# Patient Record
Sex: Male | Born: 2000 | Race: White | Hispanic: No | Smoking: Never smoker
Health system: Southern US, Community
[De-identification: ages and names within clinical notes are randomized; demographics above are authoritative.]

## PROBLEM LIST (undated history)

## (undated) DIAGNOSIS — F419 Anxiety disorder, unspecified: Secondary | ICD-10-CM

## (undated) DIAGNOSIS — F32A Depression, unspecified: Secondary | ICD-10-CM

## (undated) HISTORY — PX: INNER EAR SURGERY: SHX679

## (undated) HISTORY — DX: Anxiety disorder, unspecified: F41.9

## (undated) HISTORY — DX: Depression, unspecified: F32.A

---
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
## 2021-01-01 DIAGNOSIS — S060X0A Concussion without loss of consciousness, initial encounter: Secondary | ICD-10-CM | POA: Diagnosis not present

## 2021-01-01 DIAGNOSIS — Z20828 Contact with and (suspected) exposure to other viral communicable diseases: Secondary | ICD-10-CM | POA: Diagnosis not present

## 2021-01-22 DIAGNOSIS — J069 Acute upper respiratory infection, unspecified: Secondary | ICD-10-CM | POA: Diagnosis not present

## 2021-01-31 DIAGNOSIS — J0100 Acute maxillary sinusitis, unspecified: Secondary | ICD-10-CM | POA: Diagnosis not present

## 2021-01-31 DIAGNOSIS — Z20828 Contact with and (suspected) exposure to other viral communicable diseases: Secondary | ICD-10-CM | POA: Diagnosis not present

## 2021-02-28 DIAGNOSIS — Z20828 Contact with and (suspected) exposure to other viral communicable diseases: Secondary | ICD-10-CM | POA: Diagnosis not present

## 2021-03-05 DIAGNOSIS — H6991 Unspecified Eustachian tube disorder, right ear: Secondary | ICD-10-CM | POA: Diagnosis not present

## 2021-03-05 DIAGNOSIS — J309 Allergic rhinitis, unspecified: Secondary | ICD-10-CM | POA: Diagnosis not present

## 2021-05-23 ENCOUNTER — Other Ambulatory Visit: Payer: Self-pay

## 2021-05-23 DIAGNOSIS — F331 Major depressive disorder, recurrent, moderate: Secondary | ICD-10-CM

## 2021-05-23 DIAGNOSIS — Z0000 Encounter for general adult medical examination without abnormal findings: Secondary | ICD-10-CM

## 2021-05-23 DIAGNOSIS — F411 Generalized anxiety disorder: Secondary | ICD-10-CM

## 2021-05-24 ENCOUNTER — Other Ambulatory Visit: Payer: BC Managed Care – PPO

## 2021-05-24 ENCOUNTER — Other Ambulatory Visit: Payer: Self-pay

## 2021-05-24 DIAGNOSIS — Z0000 Encounter for general adult medical examination without abnormal findings: Secondary | ICD-10-CM | POA: Diagnosis not present

## 2021-05-24 DIAGNOSIS — E786 Lipoprotein deficiency: Secondary | ICD-10-CM | POA: Diagnosis not present

## 2021-05-24 DIAGNOSIS — F331 Major depressive disorder, recurrent, moderate: Secondary | ICD-10-CM | POA: Diagnosis not present

## 2021-05-24 DIAGNOSIS — F411 Generalized anxiety disorder: Secondary | ICD-10-CM | POA: Diagnosis not present

## 2021-05-25 LAB — COMPLETE METABOLIC PANEL WITH GFR
AG Ratio: 2 (calc) (ref 1.0–2.5)
ALT: 11 U/L (ref 8–46)
AST: 16 U/L (ref 12–32)
Albumin: 4.5 g/dL (ref 3.6–5.1)
Alkaline phosphatase (APISO): 48 U/L (ref 46–169)
BUN/Creatinine Ratio: 28 (calc) — ABNORMAL HIGH (ref 6–22)
BUN: 23 mg/dL — ABNORMAL HIGH (ref 7–20)
CO2: 30 mmol/L (ref 20–32)
Calcium: 9.4 mg/dL (ref 8.9–10.4)
Chloride: 104 mmol/L (ref 98–110)
Creat: 0.82 mg/dL (ref 0.60–1.26)
GFR, Est African American: 149 mL/min/{1.73_m2} (ref >=60)
GFR, Est Non African American: 128 mL/min/{1.73_m2} (ref >=60)
Globulin: 2.3 g/dL (calc) (ref 2.1–3.5)
Glucose, Bld: 76 mg/dL (ref 65–99)
Potassium: 4.4 mmol/L (ref 3.8–5.1)
Sodium: 141 mmol/L (ref 135–146)
Total Bilirubin: 0.5 mg/dL (ref 0.2–1.1)
Total Protein: 6.8 g/dL (ref 6.3–8.2)

## 2021-05-25 LAB — CBC WITH DIFFERENTIAL/PLATELET
Absolute Monocytes: 528 cells/uL (ref 200–950)
Basophils Absolute: 72 cells/uL (ref 0–200)
Basophils Relative: 1.2 %
Eosinophils Absolute: 210 cells/uL (ref 15–500)
Eosinophils Relative: 3.5 %
HCT: 44.7 % (ref 38.5–50.0)
Hemoglobin: 14.5 g/dL (ref 13.2–17.1)
Lymphs Abs: 2820 cells/uL (ref 850–3900)
MCH: 32 pg (ref 27.0–33.0)
MCHC: 32.4 g/dL (ref 32.0–36.0)
MCV: 98.7 fL (ref 80.0–100.0)
MPV: 10.1 fL (ref 7.5–12.5)
Monocytes Relative: 8.8 %
Neutro Abs: 2370 cells/uL (ref 1500–7800)
Neutrophils Relative %: 39.5 %
Platelets: 260 10*3/uL (ref 140–400)
RBC: 4.53 10*6/uL (ref 4.20–5.80)
RDW: 12.8 % (ref 11.0–15.0)
Total Lymphocyte: 47 %
WBC: 6 10*3/uL (ref 3.8–10.8)

## 2021-05-25 LAB — LIPID PANEL
Cholesterol: 120 mg/dL (ref <170)
HDL: 43 mg/dL — ABNORMAL LOW (ref >45)
LDL Cholesterol (Calc): 63 mg/dL (calc) (ref <110)
Non-HDL Cholesterol (Calc): 77 mg/dL (calc) (ref <120)
Total CHOL/HDL Ratio: 2.8 (calc) (ref <5.0)
Triglycerides: 55 mg/dL (ref <90)

## 2021-05-28 ENCOUNTER — Encounter: Payer: Self-pay | Admitting: Family Medicine

## 2021-05-28 ENCOUNTER — Other Ambulatory Visit: Payer: Self-pay

## 2021-05-28 ENCOUNTER — Ambulatory Visit (INDEPENDENT_AMBULATORY_CARE_PROVIDER_SITE_OTHER): Payer: BC Managed Care – PPO | Admitting: Family Medicine

## 2021-05-28 VITALS — BP 100/64 | HR 66 | Ht 71.0 in | Wt 120.4 lb

## 2021-05-28 DIAGNOSIS — F902 Attention-deficit hyperactivity disorder, combined type: Secondary | ICD-10-CM

## 2021-05-28 DIAGNOSIS — F331 Major depressive disorder, recurrent, moderate: Secondary | ICD-10-CM

## 2021-05-28 DIAGNOSIS — Z0000 Encounter for general adult medical examination without abnormal findings: Secondary | ICD-10-CM | POA: Diagnosis not present

## 2021-05-28 DIAGNOSIS — F411 Generalized anxiety disorder: Secondary | ICD-10-CM

## 2021-05-28 MED ORDER — ESCITALOPRAM OXALATE 10 MG PO TABS: 10.0000 mg | ORAL_TABLET | Freq: Every day | ORAL | 3 refills | Status: DC

## 2021-05-28 NOTE — Progress Notes (Signed)
Subjective:    Patient ID: Shane Gray, male    DOB: [birth year], 20 y.o.   MRN: [id number]  Shane Gray is a 20 y.o. male presenting on 05/28/2021 for Annual Exam and Anxiety  Accompanied by mother Selena Batten  HPI  Here for Annual Physical and Lab Review.  Generalized Anxiety Disorder - with specific social triggers Major Depression, recurrent moderate Possible ADHD   No prior testing formally for ADHD or prior diagnosis. He had inattention symptoms but has not pursued this since last visit.  Interval updates he was taking Escitalopram 20mg  for a while since December 2021, then he came off was only taking it sporadically, and he had some breakthrough panic attack then recently resumed Escitalopram 10mg  after had panic attack early June, now taking 10mg  daily escitalopram and doing better.  He will need a re order of medication.  Currently working still at now up to 40+ hours per week during summer.  He reports he has had issues for most of school carrier with difficulty focusing and challenging getting his homework done at times. He has problem with "zoning out" while listening to people talk at times, he describes a lot of creativity and thinking often can it can distract him from what is being said. 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   History of chronic anxiety and depression, recurrent problem. Previously diagnosed and treated.   He describes anxiety primary mental health issue, usually triggers in social situations and other scenarios that provoke it, he does not always have constant anxiety that is unprovoked. Mood is worse when anxious.  Right Swollen Tonsil Reported to have larger tonsil on R side, occasional sinus and throat infection but rarely usually allergies, and has history of strep in past. No recent problem not impacting his function swallowing speaking breathing.     Health Maintenance: Due  for HPV Vaccine, counseling provided he will reconsider this option.   UTD COVID19 x 2 doses back in August 2021, will request copy of card.  Depression screen [facility] 2/9 05/28/2021 11/23/2020  Decreased Interest 1 1  Down, Depressed, Hopeless 1 1  PHQ - 2 Score 2 2  Altered sleeping 3 3  Tired, decreased energy 1 2  Change in appetite 1 2  Feeling bad or failure about yourself  1 3  Trouble concentrating 3 0  Moving slowly or fidgety/restless 2 3  Suicidal thoughts 0 0  PHQ-9 Score 13 15  Difficult doing work/chores Somewhat difficult Somewhat difficult   GAD 7 : Generalized Anxiety Score 05/28/2021 11/23/2020  Nervous, Anxious, on Edge 1 1  Control/stop worrying 1 1  Worry too much - different things 1 2  Trouble relaxing 1 2  Restless 1 2  Easily annoyed or irritable 1 2  Afraid - awful might happen 1 1  Total GAD 7 Score 7 11  Anxiety Difficulty Somewhat difficult Somewhat difficult      Past Medical History:  Diagnosis Date   Anxiety    Depression    Past Surgical History:  Procedure Laterality Date   INNER EAR SURGERY     Social History   Socioeconomic History   Marital status: Single    Spouse name: Not on file   Number of children: Not on file   Years of education: Not on file   Highest education level: Not on file  Occupational History   Not on file  Tobacco Use  Smoking status: Never   Smokeless tobacco: Never  Substance and Sexual Activity   Alcohol use: Yes    Alcohol/week: 1.0 standard drink    Types: 1 Standard drinks or equivalent per week   Drug use: Yes    Comment: delta 8   Sexual activity: Not on file  Other Topics Concern   Not on file  Social History Narrative   Not on file   Social Determinants of Health   Financial Resource Strain: Not on file  Food Insecurity: Not on file  Transportation Needs: Not on file  Physical Activity: Not on file  Stress: Not on file  Social Connections: Not on file  Intimate Partner Violence: Not  on file   Family History  Problem Relation Age of Onset   Thyroid disease Mother    Anxiety disorder Mother    Depression Mother    Kidney Stones Mother    Kidney Stones Father    No current outpatient medications on file prior to visit.   No current facility-administered medications on file prior to visit.    Review of Systems  Constitutional:  Negative for activity change, appetite change, chills, diaphoresis, fatigue and fever.  HENT:  Negative for congestion and hearing loss.   Eyes:  Negative for visual disturbance.  Respiratory:  Negative for cough, chest tightness, shortness of breath and wheezing.   Cardiovascular:  Negative for chest pain, palpitations and leg swelling.  Gastrointestinal:  Negative for abdominal pain, constipation, diarrhea, nausea and vomiting.  Genitourinary:  Negative for dysuria, frequency and hematuria.  Musculoskeletal:  Negative for arthralgias and neck pain.  Skin:  Negative for rash.  Allergic/Immunologic: Positive for environmental allergies.  Neurological:  Negative for dizziness, weakness, light-headedness, numbness and headaches.  Hematological:  Negative for adenopathy.  Psychiatric/Behavioral:  Negative for behavioral problems, dysphoric mood and sleep disturbance.   Per HPI unless specifically indicated above     Objective:    BP 100/64   Pulse 66   Ht 5\' 11"  (1.803 m)   Wt 120 lb 6.4 oz (54.6 kg)   SpO2 100%   BMI 16.79 kg/m   Wt Readings from Last 3 Encounters:  05/28/21 120 lb 6.4 oz (54.6 kg) (5 %, Z= -1.67)*  11/23/20 125 lb (56.7 kg) (10 %, Z= -1.28)*   * Growth percentiles are based on CDC (Boys, 2-20 Years) data.    Physical Exam Vitals and nursing note reviewed.  Constitutional:      General: He is not in acute distress.    Appearance: He is well-developed. He is not diaphoretic.     Comments: Well-appearing, comfortable, cooperative  HENT:     Head: Normocephalic and atraumatic.     Right Ear: Tympanic  membrane, ear canal and external ear normal. There is no impacted cerumen.     Left Ear: Tympanic membrane, ear canal and external ear normal. There is no impacted cerumen.     Mouth/Throat:     Mouth: Mucous membranes are moist.     Pharynx: Oropharynx is clear.     Comments: Right tonsil appears slightly larger than average without complication. No erythema or swelling or stones or exudate. Left tonsil is smaller than average by comparison. Eyes:     General:        Right eye: No discharge.        Left eye: No discharge.     Conjunctiva/sclera: Conjunctivae normal.     Pupils: Pupils are equal, round, and reactive to light.  Neck:     Thyroid: No thyromegaly.     Vascular: No carotid bruit.  Cardiovascular:     Rate and Rhythm: Normal rate and regular rhythm.     Pulses: Normal pulses.     Heart sounds: Normal heart sounds. No murmur heard. Pulmonary:     Effort: Pulmonary effort is normal. No respiratory distress.     Breath sounds: Normal breath sounds. No wheezing or rales.  Abdominal:     General: Bowel sounds are normal. There is no distension.     Palpations: Abdomen is soft. There is no mass.     Tenderness: There is no abdominal tenderness.  Musculoskeletal:        General: No tenderness. Normal range of motion.     Cervical back: Normal range of motion and neck supple.     Right lower leg: No edema.     Left lower leg: No edema.     Comments: Upper / Lower Extremities: - Normal muscle tone, strength bilateral upper extremities 5/5, lower extremities 5/5  Lymphadenopathy:     Cervical: No cervical adenopathy.  Skin:    General: Skin is warm and dry.     Findings: No erythema or rash.  Neurological:     Mental Status: He is alert and oriented to person, place, and time.     Comments: Distal sensation intact to light touch all extremities  Psychiatric:        Mood and Affect: Mood normal.        Behavior: Behavior normal.        Thought Content: Thought content  normal.     Comments: Well groomed, good eye contact, normal speech and thoughts    Results for orders placed or performed in visit on 05/23/21  Lipid panel  Result Value Ref Range   Cholesterol 120 <170 mg/dL   HDL 43 (L) >62 mg/dL   Triglycerides 55 <94 mg/dL   LDL Cholesterol (Calc) 63 <765 mg/dL (calc)   Total CHOL/HDL Ratio 2.8 <5.0 (calc)   Non-HDL Cholesterol (Calc) 77 <465 mg/dL (calc)  COMPLETE METABOLIC PANEL WITH GFR  Result Value Ref Range   Glucose, Bld 76 65 - 99 mg/dL   BUN 23 (H) 7 - 20 mg/dL   Creat 0.35 4.65 - 6.81 mg/dL   GFR, Est Non African American 128 > OR = 60 mL/min/1.75m2   GFR, Est African American 149 > OR = 60 mL/min/1.70m2   BUN/Creatinine Ratio 28 (H) 6 - 22 (calc)   Sodium 141 135 - 146 mmol/L   Potassium 4.4 3.8 - 5.1 mmol/L   Chloride 104 98 - 110 mmol/L   CO2 30 20 - 32 mmol/L   Calcium 9.4 8.9 - 10.4 mg/dL   Total Protein 6.8 6.3 - 8.2 g/dL   Albumin 4.5 3.6 - 5.1 g/dL   Globulin 2.3 2.1 - 3.5 g/dL (calc)   AG Ratio 2.0 1.0 - 2.5 (calc)   Total Bilirubin 0.5 0.2 - 1.1 mg/dL   Alkaline phosphatase (APISO) 48 46 - 169 U/L   AST 16 12 - 32 U/L   ALT 11 8 - 46 U/L  CBC with Differential/Platelet  Result Value Ref Range   WBC 6.0 3.8 - 10.8 Thousand/uL   RBC 4.53 4.20 - 5.80 Million/uL   Hemoglobin 14.5 13.2 - 17.1 g/dL   HCT 27.5 17.0 - 01.7 %   MCV 98.7 80.0 - 100.0 fL   MCH 32.0 27.0 - 33.0 pg   MCHC  32.4 32.0 - 36.0 g/dL   RDW 16.112.8 09.611.0 - 04.515.0 %   Platelets 260 140 - 400 Thousand/uL   MPV 10.1 7.5 - 12.5 fL   Neutro Abs 2,370 1,500 - 7,800 cells/uL   Lymphs Abs 2,820 850 - 3,900 cells/uL   Absolute Monocytes 528 200 - 950 cells/uL   Eosinophils Absolute 210 15 - 500 cells/uL   Basophils Absolute 72 0 - 200 cells/uL   Neutrophils Relative % 39.5 %   Total Lymphocyte 47.0 %   Monocytes Relative 8.8 %   Eosinophils Relative 3.5 %   Basophils Relative 1.2 %      Assessment & Plan:   Problem List Items Addressed This Visit      Major depressive disorder, recurrent episode, moderate (HCC)   Relevant Medications   escitalopram (LEXAPRO) 10 MG tablet   GAD (generalized anxiety disorder)   Relevant Medications   escitalopram (LEXAPRO) 10 MG tablet   Other Visit Diagnoses     Annual physical exam    -  Primary   Attention deficit hyperactivity disorder (ADHD), combined type          Updated Health Maintenance information Reviewed recent lab results with patient Encouraged improvement to lifestyle with diet and exercise Goal maintain healthy weight  #Mental Health GAD, anxiety - primary issue Secondary Major Depression recurrent moderate Chronic history of mental health issues, has been managed on medication for years in past Not followed by therapist currently Resume Escitalopram 10mg  daily, has med, re order, try to do 90 day otherwise need mail order  Regarding ADHD concern - high level of suspicion that this may be clinically accurate diagnosis based on his mental health history and combination of symptoms/diagnoses - Will recommend formal ADHD testing and evaluation - handout given, if need referral he will let me know and we can assist with treatment if indicated   Follow-up sooner if need referral/treatment for ADHD or adjust on medication    Meds ordered this encounter  Medications   escitalopram (LEXAPRO) 10 MG tablet    Sig: Take 1 tablet (10 mg total) by mouth daily.    Dispense:  90 tablet    Refill:  3    Patient requests 90 day supply, if cannot be filled at this location, please let them know if he needs to use a mail order or other service.     Follow up plan: Return in about 1 year (around 05/28/2022) for 1 year for Annual Physical (PHQ/GAD med review), no labs.  Saralyn PilarAlexander Kawika Bischoff, DO [facility] 05/28/2021, 8:26 AM

## 2021-05-28 NOTE — Patient Instructions (Addendum)
Thank you for coming to the office today.  Please schedule and return for a NURSE ONLY VISIT for VACCINE - Need Gardasil - HPV Vaccine - 0 month, 2 month 6 month for 3 doses   --------------------------------------   This place does testing, therapy, and medication   [facility] Address: [address] [place], [place] [address] [email or web address] Phone: [phone]     ---------------------   This location only does testing and therapy. But no medication   [facility] [address] [place], [place] [address] Darden Amber P: [phone]   --------------------------------------------------------------   If you find a different location that is in network or preferred let me know - if you need ANY referral to these places then again let me know we can order it.   If they recommend medication or other changes sooner, contact us and schedule a visit in person or VIRTUAL / VIDEO whichever is preferred and we can talk what to do next.  Please schedule a Follow-up Appointment to: Return in about 1 year (around 05/28/2022) for 1 year for Annual Physical (PHQ/GAD med review), no labs.  If you have any other questions or concerns, please feel free to call the office or send a message through MyChart. You may also schedule an earlier appointment if necessary.  Additionally, you may be receiving a survey about your experience at our office within a few days to 1 week by e-mail or mail. We value your feedback.  Saralyn Pilar, DO [facility], [place]

## 2021-08-19 DIAGNOSIS — Z20828 Contact with and (suspected) exposure to other viral communicable diseases: Secondary | ICD-10-CM | POA: Diagnosis not present

## 2021-09-12 DIAGNOSIS — Z20828 Contact with and (suspected) exposure to other viral communicable diseases: Secondary | ICD-10-CM | POA: Diagnosis not present

## 2021-11-20 ENCOUNTER — Encounter: Payer: Self-pay | Admitting: Family Medicine

## 2021-11-20 ENCOUNTER — Other Ambulatory Visit: Payer: Self-pay

## 2021-11-20 ENCOUNTER — Ambulatory Visit: Payer: BC Managed Care – PPO | Admitting: Family Medicine

## 2021-11-20 VITALS — BP 126/65 | HR 78 | Ht 71.0 in | Wt 120.8 lb

## 2021-11-20 DIAGNOSIS — M62830 Muscle spasm of back: Secondary | ICD-10-CM

## 2021-11-20 DIAGNOSIS — F331 Major depressive disorder, recurrent, moderate: Secondary | ICD-10-CM

## 2021-11-20 DIAGNOSIS — F411 Generalized anxiety disorder: Secondary | ICD-10-CM

## 2021-11-20 MED ORDER — BACLOFEN 10 MG PO TABS: 5.0000 mg | ORAL_TABLET | Freq: Three times a day (TID) | ORAL | 1 refills | Status: DC | PRN

## 2021-11-20 NOTE — Progress Notes (Signed)
Subjective:    Patient ID: Shane Gray, male    DOB: [birth year], 20 y.o.   MRN: [id number]  Shane Gray is a 20 y.o. male presenting on 11/20/2021 for Back Pain and Anxiety   HPI  Generalized Anxiety Disorder - with specific social triggers Major Depression, recurrent moderate Possible ADHD  Previously on SSRI Escitalopram 10mg  daily for a few months, phased off of it and tried without. Worsening anxiety after that time remaining off medication. He now describes worsening mood and anxiety. He had been on SSRI for period of time.  He restarted Lexapro 10mg  daily just recently. He prefers to try this dose for now. He does not have therapist or mental health provider. But he is interested.  Additionla concern.  Groin Pain / Low Back Pain Few weeks ago, he had intercourse, and after at some point accidentally hit his left testicle and said it bothered him with some pain but improved.  Now at work, changed jobs, Heavy lifting 30-60 lbs boxes, can have aching pain in pelvic region and back / lower spine. Worries himself sick from this issue. He said already had walk in clinic visit - they checked for possible hernia. And they were diagnosed with more muscle strain. Some gradual improvement.   Questioning about poor appetite. He uses marijuana and asking if this is involved.   Admits some back spasms and muscle pain in low back., bilateral groin pain. Taking PRN Tylenol 500mg  x 2 = 1000mg   No prior testing formally for ADHD or prior diagnosis. He had inattention symptoms but has not pursued this since last visit.   Right Tonsil still very large. Has apt for this already  Depression screen [facility] 2/9 11/20/2021 05/28/2021 11/23/2020  Decreased Interest 3 1 1   Down, Depressed, Hopeless 3 1 1   PHQ - 2 Score 6 2 2   Altered sleeping 2 3 3   Tired, decreased energy 2 1 2   Change in appetite 2 1 2   Feeling bad or failure about yourself  3 1 3   Trouble concentrating 2 3 0   Moving slowly or fidgety/restless 2 2 3   Suicidal thoughts 0 0 0  PHQ-9 Score 19 13 15   Difficult doing work/chores Extremely dIfficult Somewhat difficult Somewhat difficult   GAD 7 : Generalized Anxiety Score 11/20/2021 05/28/2021 11/23/2020  Nervous, Anxious, on Edge 3 1 1   Control/stop worrying 3 1 1   Worry too much - different things 3 1 2   Trouble relaxing 3 1 2   Restless 3 1 2   Easily annoyed or irritable 3 1 2   Afraid - awful might happen 3 1 1   Total GAD 7 Score 21 7 11   Anxiety Difficulty Extremely difficult Somewhat difficult Somewhat difficult     Social History   Tobacco Use   Smoking status: Never   Smokeless tobacco: Never  Substance Use Topics   Alcohol use: Yes    Alcohol/week: 1.0 standard drink    Types: 1 Standard drinks or equivalent per week   Drug use: Yes    Comment: delta 8    Review of Systems Per HPI unless specifically indicated above     Objective:    BP 126/65    Pulse 78    Ht 5\' 11"  (1.803 m)    Wt 120 lb 12.8 oz (54.8 kg)    SpO2 100%    BMI 16.85 kg/m   Wt Readings from Last 3 Encounters:  11/20/21 120 lb 12.8 oz (54.8 kg) (4 %, Z= -  1.72)*  05/28/21 120 lb 6.4 oz (54.6 kg) (5 %, Z= -1.67)*  11/23/20 125 lb (56.7 kg) (10 %, Z= -1.28)*   * Growth percentiles are based on CDC (Boys, 2-20 Years) data.    Physical Exam Vitals and nursing note reviewed.  Constitutional:      General: He is not in acute distress.    Appearance: Normal appearance. He is well-developed. He is not diaphoretic.     Comments: Well-appearing, comfortable, cooperative  HENT:     Head: Normocephalic and atraumatic.  Eyes:     General:        Right eye: No discharge.        Left eye: No discharge.     Conjunctiva/sclera: Conjunctivae normal.  Cardiovascular:     Rate and Rhythm: Normal rate.  Pulmonary:     Effort: Pulmonary effort is normal.  Genitourinary:    Comments: Male external genitalia exam today. No obvious deformity or issues, no swelling,  edema, erythema. Exam of scrotum shows no cyst or lump or bump, non tender. No swelling.  No evidence of inguinal hernia on exam, not provoked by valsalva and maneuver today Musculoskeletal:     Comments: Bilateral low back musculature with tension and spasm hypertonicity paraspinal lumbar muscles. SLR not provoking pain. No radicular symptoms.  Bilateral inner thigh groin muscles without deformity or issues, seems to be localized pain.   Skin:    General: Skin is warm and dry.     Findings: No erythema or rash.  Neurological:     Mental Status: He is alert and oriented to person, place, and time.  Psychiatric:        Mood and Affect: Mood normal.        Behavior: Behavior normal.        Thought Content: Thought content normal.     Comments: Well groomed, good eye contact, normal speech and thoughts   Results for orders placed or performed in visit on 05/23/21  Lipid panel  Result Value Ref Range   Cholesterol 120 <170 mg/dL   HDL 43 (L) >16 mg/dL   Triglycerides 55 <10 mg/dL   LDL Cholesterol (Calc) 63 <960 mg/dL (calc)   Total CHOL/HDL Ratio 2.8 <5.0 (calc)   Non-HDL Cholesterol (Calc) 77 <454 mg/dL (calc)  COMPLETE METABOLIC PANEL WITH GFR  Result Value Ref Range   Glucose, Bld 76 65 - 99 mg/dL   BUN 23 (H) 7 - 20 mg/dL   Creat 0.98 1.19 - 1.47 mg/dL   GFR, Est Non African American 128 > OR = 60 mL/min/1.75m2   GFR, Est African American 149 > OR = 60 mL/min/1.46m2   BUN/Creatinine Ratio 28 (H) 6 - 22 (calc)   Sodium 141 135 - 146 mmol/L   Potassium 4.4 3.8 - 5.1 mmol/L   Chloride 104 98 - 110 mmol/L   CO2 30 20 - 32 mmol/L   Calcium 9.4 8.9 - 10.4 mg/dL   Total Protein 6.8 6.3 - 8.2 g/dL   Albumin 4.5 3.6 - 5.1 g/dL   Globulin 2.3 2.1 - 3.5 g/dL (calc)   AG Ratio 2.0 1.0 - 2.5 (calc)   Total Bilirubin 0.5 0.2 - 1.1 mg/dL   Alkaline phosphatase (APISO) 48 46 - 169 U/L   AST 16 12 - 32 U/L   ALT 11 8 - 46 U/L  CBC with Differential/Platelet  Result Value Ref Range    WBC 6.0 3.8 - 10.8 Thousand/uL   RBC 4.53 4.20 - 5.80  Million/uL   Hemoglobin 14.5 13.2 - 17.1 g/dL   HCT 07.6 15.1 - 83.4 %   MCV 98.7 80.0 - 100.0 fL   MCH 32.0 27.0 - 33.0 pg   MCHC 32.4 32.0 - 36.0 g/dL   RDW 37.3 57.8 - 97.8 %   Platelets 260 140 - 400 Thousand/uL   MPV 10.1 7.5 - 12.5 fL   Neutro Abs 2,370 1,500 - 7,800 cells/uL   Lymphs Abs 2,820 850 - 3,900 cells/uL   Absolute Monocytes 528 200 - 950 cells/uL   Eosinophils Absolute 210 15 - 500 cells/uL   Basophils Absolute 72 0 - 200 cells/uL   Neutrophils Relative % 39.5 %   Total Lymphocyte 47.0 %   Monocytes Relative 8.8 %   Eosinophils Relative 3.5 %   Basophils Relative 1.2 %      Assessment & Plan:   Problem List Items Addressed This Visit     Major depressive disorder, recurrent episode, moderate (HCC)   GAD (generalized anxiety disorder) - Primary   Other Visit Diagnoses     Spasm of back muscles       Relevant Medications   baclofen (LIORESAL) 10 MG tablet       #Mental Health GAD, anxiety - primary issue Secondary Major Depression recurrent moderate Chronic history of mental health issues, has been managed on medication for years in past Not followed by therapist currently  Encourage him to keep on  and resume Escitalopram 10mg  daily, has med, no new rx needed yet. Advised we can adjust dose if need or change or add other therapy.  Handout given with list of Mental health providers psych / therapist - I strongly encourage setting up another apt for mental health provider now given limited success and severity of his symptoms.  He should pursue eval for ADHD as well in future if this becomes problem.  #Back Pain Mostly characteristic of muscle spasms and strain on exam, with heavy lifting repetitive task at work. Groin pain seems related or not concerning. Scrotal exam is normal. May have groin strain with back pain but no evidence of hernia on exam.  Baclofen 5-10mg  TID PRN caution  sedation Tylenol, NSAID, topical voltaren Advice per AVS   Meds ordered this encounter  Medications   baclofen (LIORESAL) 10 MG tablet    Sig: Take 0.5-1 tablets (5-10 mg total) by mouth 3 (three) times daily as needed for muscle spasms.    Dispense:  30 each    Refill:  1      Follow up plan: Return in about 3 months (around 02/18/2022) for 3-6 month follow-up mood, anxiety, back spasms.   02/20/2022, DO [facility] 11/20/2021, 11:08 AM

## 2021-11-20 NOTE — Patient Instructions (Addendum)
Thank you for coming to the office today.  Keep on the Escitalopram Lexapro 10mg  daily for now, we can adjust your dose in future if needed.  Check out these locations and I would recommend establishing with someone to help - especially therapist and once you find where you want to go, let me know and I can refer.  ------------------------------------------------------  Likely back spasms in lumbar spine  Start taking Baclofen (Lioresal) 10mg  (muscle relaxant) - start with half (cut) to one whole pill at night as needed for next 1-3 nights (may make you drowsy, caution with driving) see how it affects you, then if tolerated increase to one pill 2 to 3 times a day or (every 8 hours as needed)  START anti inflammatory topical - OTC Voltaren (generic Diclofenac) topical 2-4 times a day as needed for pain swelling of affected joint for 1-2 weeks or longer.  Ibuprofen is okay to use as needed   These offices have both PSYCHIATRY doctors and  Available) [place] [address] [place], [address] [place] Phone: [phone]  [facility] Address: [address], [place], [address] [place] [email or web address] Phone: [phone]  [facility]) Address: [address], [place], [address] [place] Hours: 8:30AM-5PM Phone: [phone]  [facility] at [facility] [address] [place], [address] [place] Phone: [phone]  [facility] (All ages) [address], [facility] [place] [place], [place] Phone: [phone] (Option 1) [email or web address]  ----------------------------------------------------------------- THERAPIST ONLY  (No Psychiatry)  [facility] [address] [place], [address] [place] P: [phone]  Darden Amber, LCSW [address] [place], [place] Main [place]: [phone]  [facility]   Address: [address] [place], [place], [place] Hours: Open today  9AM-7PM Phone: [phone]  [facility] [address] [facility]  - [facility] Address: [address], [place], [address] [place] Phone: [phone]   Please schedule a Follow-up Appointment to: Return in about 3 months (around 02/18/2022) for 3-6 month follow-up mood, anxiety, back spasms.  If you have any other questions or concerns, please feel free to call the office or send a message through MyChart. You may also schedule an earlier appointment if necessary.  Additionally, you may be receiving a survey about your experience at our office within a few days to 1 week by e-mail or mail. We value your feedback.  [phone], DO [facility], Saralyn Pilar

## 2021-11-25 ENCOUNTER — Ambulatory Visit: Payer: Self-pay | Admitting: *Deleted

## 2021-11-25 NOTE — Telephone Encounter (Signed)
Reason for Disposition  Pain radiates into groin, scrotum  Answer Assessment - Initial Assessment Questions 1. LOCATION: "Where does it hurt?" (e.g., left, right)     Pt calling in c/o back pain and testicular pain.   Right lower flank to thigh last night pain.    When I move it's worse.   No urinary symptoms.   No kidney stone history.    2. ONSET: "When did the pain start?"     A week-2 weeks ago Tylenol or ibuprofen helps a little.   Dr. Althea Charon gave me back medicine that helps for muscle spasms.   3. SEVERITY: "How bad is the pain?" (e.g., Scale 1-10; mild, moderate, or severe)   - MILD (1-3): doesn't interfere with normal activities    - MODERATE (4-7): interferes with normal activities or awakens from sleep    - SEVERE (8-10): excruciating pain and patient unable to do normal activities (stays in bed)       3 this morning.    Worst 7 last night 4. PATTERN: "Does the pain come and go, or is it constant?"      Comes and goes.   I feel it when I'm not doing anything 5. CAUSE: "What do you think is causing the pain?"     I was working and fell on my back a couple of times.   It could STD.   6. OTHER SYMPTOMS:  "Do you have any other symptoms?" (e.g., fever, abdominal pain, vomiting, leg weakness, burning with urination, blood in urine)     Pain in testicle but went away and now the pain is back in left one. 7. PREGNANCY:  "Is there any chance you are pregnant?" "When was your last menstrual period?"     N/A  Protocols used: Flank Pain-A-AH

## 2021-11-25 NOTE — Telephone Encounter (Addendum)
Spoke with the pt's mom. 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 I have made him an appt for 12/22 at 9:20.

## 2021-11-25 NOTE — Telephone Encounter (Signed)
°  Chief Complaint: left back pain into left testicle Symptoms: above Frequency: intermittent Pertinent Negatives: Patient denies urinary symptoms.   Requesting STD testing.   Has seen Dr. Althea Charon for this pain before. Disposition: [] ED /[] Urgent Care (no appt availability in office) / [x] Appointment(In office/virtual)/ []  [place] [facility]/ [] Home Care/ [] Refused Recommended Disposition  Additional Notes: I have sent a note to Medical to see if they can get him an earlier appt.   No one answered the phone when I tried to call into the practice.   Pt was agreeable to this plan.    Notes sent to [facility]

## 2021-11-28 ENCOUNTER — Other Ambulatory Visit (HOSPITAL_COMMUNITY)
Admission: RE | Admit: 2021-11-28 | Discharge: 2021-11-28 | Disposition: A | Discharge status: Home / Self Care | Payer: BC Managed Care – PPO | Source: Ambulatory Visit | Attending: Family Medicine | Admitting: Family Medicine

## 2021-11-28 ENCOUNTER — Ambulatory Visit: Payer: BC Managed Care – PPO | Admitting: Family Medicine

## 2021-11-28 ENCOUNTER — Other Ambulatory Visit: Payer: Self-pay

## 2021-11-28 ENCOUNTER — Encounter: Payer: Self-pay | Admitting: Family Medicine

## 2021-11-28 VITALS — BP 115/71 | HR 91 | Ht 71.0 in | Wt 116.4 lb

## 2021-11-28 DIAGNOSIS — Z113 Encounter for screening for infections with a predominantly sexual mode of transmission: Secondary | ICD-10-CM

## 2021-11-28 DIAGNOSIS — F411 Generalized anxiety disorder: Secondary | ICD-10-CM

## 2021-11-28 DIAGNOSIS — M62830 Muscle spasm of back: Secondary | ICD-10-CM

## 2021-11-28 DIAGNOSIS — B372 Candidiasis of skin and nail: Secondary | ICD-10-CM

## 2021-11-28 DIAGNOSIS — M545 Low back pain, unspecified: Secondary | ICD-10-CM

## 2021-11-28 DIAGNOSIS — R29898 Other symptoms and signs involving the musculoskeletal system: Secondary | ICD-10-CM

## 2021-11-28 MED ORDER — CLOTRIMAZOLE 1 % EX CREA: 1.0000 "application " | TOPICAL_CREAM | Freq: Two times a day (BID) | CUTANEOUS | 0 refills | Status: DC

## 2021-11-28 NOTE — Patient Instructions (Addendum)
Thank you for coming to the office today.  [facility] Dept  Appointments: [phone] For all STD testing for free  For Back X-ray - order is in, make sure it has been at least 4 weeks since it started, if you decide to get done. Walk in first come first serve, 8am through 4pm - Mon-Thurs, not on Friday. Not during lunch hour 12-1.  STD testing today blood and urine, checking for HIV Syphilis, Gonorrhea, Chlamydia, few days for urine results, about 24-48 hr on blood results.  For the rash, in the groin, looks like yeast or candida rash. Use topical anti fungal twice a day for 2-4 weeks. - likely from heat/moisture/sweat   Please schedule a Follow-up Appointment to: Return if symptoms worsen or fail to improve.  If you have any other questions or concerns, please feel free to call the office or send a message through MyChart. You may also schedule an earlier appointment if necessary.  Additionally, you may be receiving a survey about your experience at our office within a few days to 1 week by e-mail or mail. We value your feedback.  Saralyn Pilar, DO [facility], [place]

## 2021-11-28 NOTE — Progress Notes (Signed)
Subjective:    Patient ID: Shane Gray, male    DOB: 2001-06-12, 20 y.o.   MRN: [id number]  Shane Gray is a 20 y.o. male presenting on 11/28/2021 for Exposure to STD and Back Pain   HPI  STD Screening Rash Groin Pain / Low Back Pain Few weeks ago, he had intercourse, and after at some point accidentally hit his left testicle and said it bothered him with some pain but improved.   Work with Heavy lifting 30-60 lbs boxes, can have aching pain in pelvic region and back / lower spine. Worries himself sick from this issue. He said already had walk in clinic visit - they checked for possible hernia. And they were diagnosed with more muscle strain. Some gradual improvement.    Last visit 1 week ago approx. He has improved some, but has concerns with rash now in groin. He has redness in R groin inguinal crease. Some burning of skin at times. No raised rash or vesicles.  He also endorses some episodic weakness of arms and legs feels it can be anxiety or pinched nerve. Worse with repetitive activity at work.    Depression screen [facility] 2/9 11/20/2021 05/28/2021 11/23/2020  Decreased Interest 3 1 1   Down, Depressed, Hopeless 3 1 1   PHQ - 2 Score 6 2 2   Altered sleeping 2 3 3   Tired, decreased energy 2 1 2   Change in appetite 2 1 2   Feeling bad or failure about yourself  3 1 3   Trouble concentrating 2 3 0  Moving slowly or fidgety/restless 2 2 3   Suicidal thoughts 0 0 0  PHQ-9 Score 19 13 15   Difficult doing work/chores Extremely dIfficult Somewhat difficult Somewhat difficult    Social History   Tobacco Use   Smoking status: Never   Smokeless tobacco: Never  Substance Use Topics   Alcohol use: Yes    Alcohol/week: 1.0 standard drink    Types: 1 Standard drinks or equivalent per week   Drug use: Yes    Comment: delta 8    Review of Systems Per HPI unless specifically indicated above     Objective:    BP 115/71    Pulse 91    Ht 5\' 11"  (1.803 m)    Wt 116  lb 6.4 oz (52.8 kg)    SpO2 100%    BMI 16.23 kg/m   Wt Readings from Last 3 Encounters:  11/28/21 116 lb 6.4 oz (52.8 kg) (2 %, Z= -2.03)*  11/20/21 120 lb 12.8 oz (54.8 kg) (4 %, Z= -1.72)*  05/28/21 120 lb 6.4 oz (54.6 kg) (5 %, Z= -1.67)*   * Growth percentiles are based on CDC (Boys, 2-20 Years) data.    Physical Exam Vitals and nursing note reviewed.  Constitutional:      General: He is not in acute distress.    Appearance: Normal appearance. He is well-developed. He is not diaphoretic.     Comments: Well-appearing, comfortable, cooperative  HENT:     Head: Normocephalic and atraumatic.  Eyes:     General:        Right eye: No discharge.        Left eye: No discharge.     Conjunctiva/sclera: Conjunctivae normal.  Cardiovascular:     Rate and Rhythm: Normal rate.  Pulmonary:     Effort: Pulmonary effort is normal.  Skin:    General: Skin is warm and dry.     Findings: Rash (R inguinal skin fold  slight erythema with candidal rash) present. No erythema.  Neurological:     Mental Status: He is alert and oriented to person, place, and time.  Psychiatric:        Mood and Affect: Mood normal.        Behavior: Behavior normal.        Thought Content: Thought content normal.     Comments: Well groomed, good eye contact, normal speech and thoughts   Results for orders placed or performed in visit on 05/23/21  Lipid panel  Result Value Ref Range   Cholesterol 120 <170 mg/dL   HDL 43 (L) >61 mg/dL   Triglycerides 55 <60 mg/dL   LDL Cholesterol (Calc) 63 <737 mg/dL (calc)   Total CHOL/HDL Ratio 2.8 <5.0 (calc)   Non-HDL Cholesterol (Calc) 77 <106 mg/dL (calc)  COMPLETE METABOLIC PANEL WITH GFR  Result Value Ref Range   Glucose, Bld 76 65 - 99 mg/dL   BUN 23 (H) 7 - 20 mg/dL   Creat 2.69 4.85 - 4.62 mg/dL   GFR, Est Non African American 128 > OR = 60 mL/min/1.22m2   GFR, Est African American 149 > OR = 60 mL/min/1.31m2   BUN/Creatinine Ratio 28 (H) 6 - 22 (calc)   Sodium  141 135 - 146 mmol/L   Potassium 4.4 3.8 - 5.1 mmol/L   Chloride 104 98 - 110 mmol/L   CO2 30 20 - 32 mmol/L   Calcium 9.4 8.9 - 10.4 mg/dL   Total Protein 6.8 6.3 - 8.2 g/dL   Albumin 4.5 3.6 - 5.1 g/dL   Globulin 2.3 2.1 - 3.5 g/dL (calc)   AG Ratio 2.0 1.0 - 2.5 (calc)   Total Bilirubin 0.5 0.2 - 1.1 mg/dL   Alkaline phosphatase (APISO) 48 46 - 169 U/L   AST 16 12 - 32 U/L   ALT 11 8 - 46 U/L  CBC with Differential/Platelet  Result Value Ref Range   WBC 6.0 3.8 - 10.8 Thousand/uL   RBC 4.53 4.20 - 5.80 Million/uL   Hemoglobin 14.5 13.2 - 17.1 g/dL   HCT 70.3 50.0 - 93.8 %   MCV 98.7 80.0 - 100.0 fL   MCH 32.0 27.0 - 33.0 pg   MCHC 32.4 32.0 - 36.0 g/dL   RDW 18.2 99.3 - 71.6 %   Platelets 260 140 - 400 Thousand/uL   MPV 10.1 7.5 - 12.5 fL   Neutro Abs 2,370 1,500 - 7,800 cells/uL   Lymphs Abs 2,820 850 - 3,900 cells/uL   Absolute Monocytes 528 200 - 950 cells/uL   Eosinophils Absolute 210 15 - 500 cells/uL   Basophils Absolute 72 0 - 200 cells/uL   Neutrophils Relative % 39.5 %   Total Lymphocyte 47.0 %   Monocytes Relative 8.8 %   Eosinophils Relative 3.5 %   Basophils Relative 1.2 %      Assessment & Plan:   Problem List Items Addressed This Visit     GAD (generalized anxiety disorder)   Other Visit Diagnoses     Screening examination for STD (sexually transmitted disease)    -  Primary   Relevant Orders   GC/Chlamydia probe amp ([place])not at [facility]   HIV Antibody (routine testing w rflx)   RPR   Spasm of back muscles       Candidal intertrigo       Relevant Medications   clotrimazole (LOTRIMIN) 1 % cream   Muscular deconditioning       Acute bilateral  low back pain without sciatica       Relevant Orders   DG Lumbar Spine Complete       GAD Underlying anxiety likely component.  STD Screening No confirmed exposure Ordered GC Chlamydia urine today, HIV RPR labs F/u results  Candidal Intertrigo Mild R inguinal Rx Clotrimazole cream  Back  Pain, Spasms Likely more deconditioning No acute injury Reassuring - history of weakness is not consistent with exam and seems to be more within normal range. Ordered Lumbar X-ray for FUTURE ONLY if he has worsening symptoms or new concerns >4 weeks he can do walk in for x-ray  Orders Placed This Encounter  Procedures   DG Lumbar Spine Complete    Standing Status:   Future    Standing Expiration Date:   11/28/2022    Order Specific Question:   Reason for Exam (SYMPTOM  OR DIAGNOSIS REQUIRED)    Answer:   low back pain 4 weeks, heavy lifting job has some symptoms radiating into legs    Order Specific Question:   Preferred imaging location?    Answer:   [facility]   HIV Antibody (routine testing w rflx)   RPR     Meds ordered this encounter  Medications   clotrimazole (LOTRIMIN) 1 % cream    Sig: Apply 1 application topically 2 (two) times daily. For up to 2-4 weeks for rash    Dispense:  30 g    Refill:  0      Follow up plan: Return if symptoms worsen or fail to improve.   Saralyn Pilar, DO [facility] 11/28/2021, 9:48 AM

## 2021-11-29 ENCOUNTER — Encounter: Payer: Self-pay | Admitting: Family Medicine

## 2021-11-29 LAB — HIV ANTIBODY (ROUTINE TESTING W REFLEX): HIV 1&2 Ab, 4th Generation: NONREACTIVE

## 2021-11-29 LAB — RPR: RPR Ser Ql: NONREACTIVE

## 2021-12-03 LAB — GC/CHLAMYDIA PROBE AMP (~~LOC~~) NOT AT ARMC
Chlamydia: NEGATIVE
Comment: NEGATIVE
Comment: NORMAL
Neisseria Gonorrhea: NEGATIVE

## 2021-12-05 ENCOUNTER — Encounter: Payer: Self-pay | Admitting: Family Medicine

## 2021-12-12 ENCOUNTER — Encounter: Payer: Self-pay | Admitting: Family Medicine

## 2021-12-12 ENCOUNTER — Other Ambulatory Visit: Payer: Self-pay

## 2021-12-12 ENCOUNTER — Ambulatory Visit: Payer: Self-pay | Admitting: Family Medicine

## 2021-12-12 DIAGNOSIS — B356 Tinea cruris: Secondary | ICD-10-CM | POA: Diagnosis not present

## 2021-12-12 DIAGNOSIS — R21 Rash and other nonspecific skin eruption: Secondary | ICD-10-CM | POA: Diagnosis not present

## 2021-12-12 DIAGNOSIS — Z708 Other sex counseling: Secondary | ICD-10-CM

## 2021-12-12 NOTE — Progress Notes (Signed)
S: pt in clinic, requesting testing for HSV, pt was recently tested for other STI's at PCP on 11/28/21.  Pt denies any sexual encounters since testing.  Pt also reports some itching between thigh and testicles.    O: [facility] -  testing for RPR, HIV, GC, and CT- negative   Male genitalia exam: normal, penis: no lesions or discharge. testes: no masses or tenderness. no hernias Pt as mild erythema area in groin area.    A: .1. Encounter for sexually transmitted disease counseling  P: discussed with patient about HSV testing at [facility].  Recommended UC or PCP for testing for HSV via blood work.   Pt. Denies any ulcers, lesions or open sores.  Pt declined any other STI testing today.     Pt reports sweating and groin area moist mostly while at work.   Recommend OTC clotrimazole 1% cream.   Pt encourage to RTC as needed.    Junious Dresser, FNP

## 2021-12-25 DIAGNOSIS — J351 Hypertrophy of tonsils: Secondary | ICD-10-CM | POA: Diagnosis not present

## 2022-06-02 ENCOUNTER — Encounter: Payer: BC Managed Care – PPO | Admitting: Family Medicine

## 2022-06-30 ENCOUNTER — Other Ambulatory Visit: Payer: Self-pay | Admitting: Family Medicine

## 2022-06-30 DIAGNOSIS — F331 Major depressive disorder, recurrent, moderate: Secondary | ICD-10-CM

## 2022-06-30 DIAGNOSIS — F411 Generalized anxiety disorder: Secondary | ICD-10-CM

## 2022-07-02 NOTE — Telephone Encounter (Signed)
Requested medication (s) are due for refill today:yes  Requested medication (s) are on the active medication list yes  Last refill: 05/29/21  Future visit scheduled no  Notes to clinic:  Unable to refill per protocol, appointment needed. Called pt, no answer and unable to leave VM. Routing for approval.     Requested Prescriptions  Pending Prescriptions Disp Refills   escitalopram (LEXAPRO) 10 MG tablet [Pharmacy Med Name: ESCITALOPRAM 10MG  TABLETS] 90 tablet 3    Sig: TAKE 1 TABLET(10 MG) BY MOUTH DAILY     Psychiatry:  Antidepressants - SSRI Failed - 06/30/2022  1:11 PM      Failed - Valid encounter within last 6 months    Recent Outpatient Visits           7 months ago Screening examination for STD (sexually transmitted disease)   [facility], DO   7 months ago GAD (generalized anxiety disorder)   [facility], DO   1 year ago Annual physical exam   [facility], DO   1 year ago GAD (generalized anxiety disorder)   [facility], DO              Passed - Completed PHQ-2 or PHQ-9 in the last 360 days

## 2022-08-31 ENCOUNTER — Encounter: Payer: Self-pay | Admitting: Family Medicine

## 2022-09-03 ENCOUNTER — Encounter: Payer: Self-pay | Admitting: Family Medicine

## 2022-09-03 ENCOUNTER — Ambulatory Visit: Payer: BC Managed Care – PPO | Admitting: Family Medicine

## 2022-09-03 VITALS — BP 95/58 | HR 64 | Ht 71.0 in | Wt 114.2 lb

## 2022-09-03 DIAGNOSIS — F411 Generalized anxiety disorder: Secondary | ICD-10-CM

## 2022-09-03 DIAGNOSIS — F331 Major depressive disorder, recurrent, moderate: Secondary | ICD-10-CM

## 2022-09-03 MED ORDER — BUSPIRONE HCL 5 MG PO TABS: 5.0000 mg | ORAL_TABLET | Freq: Three times a day (TID) | ORAL | 3 refills | Status: DC | PRN
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
## 2022-09-03 NOTE — Patient Instructions (Addendum)
Thank you for coming to the office today.  Keep on Escitalopram 10mg  daily, no change today.  Add new medication for Anxiety Buspar (buspirone) 5mg  tablet, take up to max 3 times per day, approximately every 6-8 hours if needed. Can take once or twice a day every day consistently at first and use the 3rd pill as a back up plan if needed.  Future we can reconsider the Wellbutrin for mood, can also work together with these meds.  Next options also - would consider switching off Lexapro for a different version. - Zoloft (sertraline) recommended.  Muscle tension in back / shoulders feels consistent with muscle spasm from stress and can cause your symptoms.  Please schedule a Follow-up Appointment to: Return if symptoms worsen or fail to improve.  If you have any other questions or concerns, please feel free to call the office or send a message through [place]. You may also schedule an earlier appointment if necessary.  Additionally, you may be receiving a survey about your experience at our office within a few days to 1 week by e-mail or mail. We value your feedback.  Nobie Putnam, DO [place]

## 2022-10-22 ENCOUNTER — Encounter: Payer: BC Managed Care – PPO | Admitting: Family Medicine

## 2022-10-24 ENCOUNTER — Encounter: Payer: Self-pay | Admitting: Family Medicine

## 2022-10-24 ENCOUNTER — Ambulatory Visit: Payer: BC Managed Care – PPO | Admitting: Family Medicine

## 2022-10-24 VITALS — BP 105/61 | HR 78 | Ht 70.0 in | Wt 117.2 lb

## 2022-10-24 DIAGNOSIS — Z0000 Encounter for general adult medical examination without abnormal findings: Secondary | ICD-10-CM | POA: Diagnosis not present

## 2022-10-24 DIAGNOSIS — F411 Generalized anxiety disorder: Secondary | ICD-10-CM

## 2022-10-24 DIAGNOSIS — F331 Major depressive disorder, recurrent, moderate: Secondary | ICD-10-CM | POA: Diagnosis not present

## 2022-10-24 MED ORDER — ESCITALOPRAM OXALATE 10 MG PO TABS: 10.0000 mg | ORAL_TABLET | Freq: Every day | ORAL | 3 refills | Status: DC

## 2022-10-24 NOTE — Progress Notes (Signed)
Subjective:    Patient ID: Shane Gray, male    DOB: 28-Aug-2001, 21 y.o.   MRN: [id number]  Shane Gray is a 21 y.o. male presenting on 10/24/2022 for Annual Exam   HPI  Here for Annual Physical and Lab Review.   Generalized Anxiety Disorder - with specific social triggers Major Depression, recurrent moderate Possible ADHD  Interval update since last visit 08/2022 started on Buspar 5mg  THREE TIMES A DAY as needed, has done well taking it occasionally. It is helpful to reduce anxiety. - Forgetfulness minor things but overall not impacting his function.  Overall anxiety is improving overall. It is currently manageable and eating well sleeping well  Continues Escitalopram 10mg  daily    Health Maintenance: Due for HPV Vaccine, counseling provided he will reconsider this option.   UTD COVID19 x 2 doses back in August 2021, will request copy of card.      10/24/2022    9:14 AM 09/03/2022    9:40 AM 11/20/2021   11:02 AM  Depression screen PHQ 2/9  Decreased Interest 2 3 3   Down, Depressed, Hopeless 1 3 3   PHQ - 2 Score 3 6 6   Altered sleeping 3 3 2   Tired, decreased energy 3 3 2   Change in appetite 2 3 2   Feeling bad or failure about yourself  1 3 3   Trouble concentrating 2 2 2   Moving slowly or fidgety/restless 2 3 2   Suicidal thoughts 1 1 0  PHQ-9 Score 17 24 19   Difficult doing work/chores Somewhat difficult Very difficult Extremely dIfficult      10/24/2022    9:14 AM 09/03/2022    9:41 AM 11/20/2021   11:02 AM 05/28/2021    8:11 AM  GAD 7 : Generalized Anxiety Score  Nervous, Anxious, on Edge 2 3 3 1   Control/stop worrying 2 3 3 1   Worry too much - different things 2 3 3 1   Trouble relaxing 2 3 3 1   Restless 2 3 3 1   Easily annoyed or irritable 2 3 3 1   Afraid - awful might happen 2 3 3 1   Total GAD 7 Score 14 21 21 7   Anxiety Difficulty Very difficult Very difficult Extremely difficult Somewhat difficult     Past Medical History:   Diagnosis Date   Anxiety    Depression    Past Surgical History:  Procedure Laterality Date   INNER EAR SURGERY     Social History   Socioeconomic History   Marital status: Single    Spouse name: Not on file   Number of children: Not on file   Years of education: Not on file   Highest education level: Not on file  Occupational History   Not on file  Tobacco Use   Smoking status: Never   Smokeless tobacco: Never  Vaping Use   Vaping Use: Never used  Substance and Sexual Activity   Alcohol use: Yes    Alcohol/week: 2.0 standard drinks of alcohol    Types: 1 Cans of beer, 1 Standard drinks or equivalent per week   Drug use: Yes    Types: Marijuana    Comment: delta 8, last used used 11/28/21   Sexual activity: Yes    Birth control/protection: Condom  Other Topics Concern   Not on file  Social History Narrative   Not on file   Social Determinants of Health   Financial Resource Strain: Not on file  Food Insecurity: Not on file  Transportation Needs:  Not on file  Physical Activity: Not on file  Stress: Not on file  Social Connections: Not on file  Intimate Partner Violence: Not At Risk (12/12/2021)   Humiliation, Afraid, Rape, and Kick questionnaire    Fear of Current or Ex-Partner: No    Emotionally Abused: No    Physically Abused: No    Sexually Abused: No   Family History  Problem Relation Age of Onset   Thyroid disease Mother    Anxiety disorder Mother    Depression Mother    Kidney Stones Mother    Kidney Stones Father    Current Outpatient Medications on File Prior to Visit  Medication Sig   busPIRone (BUSPAR) 5 MG tablet Take 1 tablet (5 mg total) by mouth 3 (three) times daily as needed (anxiety).   No current facility-administered medications on file prior to visit.    Review of Systems  Constitutional:  Negative for activity change, appetite change, chills, diaphoresis, fatigue and fever.  HENT:  Negative for congestion and hearing loss.    Eyes:  Negative for visual disturbance.  Respiratory:  Negative for cough, chest tightness, shortness of breath and wheezing.   Cardiovascular:  Negative for chest pain, palpitations and leg swelling.  Gastrointestinal:  Negative for abdominal pain, constipation, diarrhea, nausea and vomiting.  Genitourinary:  Negative for dysuria, frequency and hematuria.  Musculoskeletal:  Negative for arthralgias and neck pain.  Skin:  Negative for rash.  Neurological:  Negative for dizziness, weakness, light-headedness, numbness and headaches.  Hematological:  Negative for adenopathy.  Psychiatric/Behavioral:  Negative for behavioral problems, dysphoric mood and sleep disturbance.    Per HPI unless specifically indicated above      Objective:    BP 105/61   Pulse 78   Ht 5\' 10"  (1.778 m)   Wt 117 lb 3.2 oz (53.2 kg)   SpO2 100%   BMI 16.82 kg/m   Wt Readings from Last 3 Encounters:  10/24/22 117 lb 3.2 oz (53.2 kg)  09/03/22 114 lb 3.2 oz (51.8 kg)  11/28/21 116 lb 6.4 oz (52.8 kg) (2 %, Z= -2.03)*   * Growth percentiles are based on CDC (Boys, 2-20 Years) data.    Physical Exam Vitals and nursing note reviewed.  Constitutional:      General: He is not in acute distress.    Appearance: He is well-developed. He is not diaphoretic.     Comments: Well-appearing, comfortable, cooperative  HENT:     Head: Normocephalic and atraumatic.     Right Ear: There is no impacted cerumen.     Left Ear: There is impacted cerumen.  Eyes:     General:        Right eye: No discharge.        Left eye: No discharge.     Conjunctiva/sclera: Conjunctivae normal.     Pupils: Pupils are equal, round, and reactive to light.  Neck:     Thyroid: No thyromegaly.  Cardiovascular:     Rate and Rhythm: Normal rate and regular rhythm.     Pulses: Normal pulses.     Heart sounds: Normal heart sounds. No murmur heard. Pulmonary:     Effort: Pulmonary effort is normal. No respiratory distress.     Breath  sounds: Normal breath sounds. No wheezing or rales.  Abdominal:     General: Bowel sounds are normal. There is no distension.     Palpations: Abdomen is soft. There is no mass.     Tenderness: There is  no abdominal tenderness.  Musculoskeletal:        General: No tenderness. Normal range of motion.     Cervical back: Normal range of motion and neck supple.     Comments: Upper / Lower Extremities: - Normal muscle tone, strength bilateral upper extremities 5/5, lower extremities 5/5  Lymphadenopathy:     Cervical: No cervical adenopathy.  Skin:    General: Skin is warm and dry.     Findings: No erythema or rash.  Neurological:     Mental Status: He is alert and oriented to person, place, and time.     Comments: Distal sensation intact to light touch all extremities  Psychiatric:        Mood and Affect: Mood normal.        Behavior: Behavior normal.        Thought Content: Thought content normal.     Comments: Well groomed, good eye contact, normal speech and thoughts    Results for orders placed or performed in visit on 11/28/21  HIV Antibody (routine testing w rflx)  Result Value Ref Range   HIV 1&2 Ab, 4th Generation NON-REACTIVE NON-REACTIVE  RPR  Result Value Ref Range   RPR Ser Ql NON-REACTIVE NON-REACTIVE  GC/Chlamydia probe amp ([place])not at [facility]  Result Value Ref Range   Neisseria Gonorrhea Negative    Chlamydia Negative    Comment Normal Reference Ranger Chlamydia - Negative    Comment      Normal Reference Range Neisseria Gonorrhea - Negative      Assessment & Plan:   Problem List Items Addressed This Visit     GAD (generalized anxiety disorder)   Relevant Medications   escitalopram (LEXAPRO) 10 MG tablet   Major depressive disorder, recurrent episode, moderate (HCC)   Relevant Medications   escitalopram (LEXAPRO) 10 MG tablet   Other Visit Diagnoses     Annual physical exam    -  Primary       Updated Health Maintenance information No blood  work indicated at this time. Encouraged improvement to lifestyle with diet and exercise Goal of weight loss  Future reconsider HPV Vaccine, 3 dose series, can do anytime, nurse visit.  Controlled GAD and Depression Still active mood, but it is improved. Continue current treatment plan with Buspar as needed and Lexapro every day.  The forgetfulness is quite normal. I do think it is related to mind/anxiety/ADHD possibly  If we can establish with someone in therapy they can help [place] with further ADHD testing.   Meds ordered this encounter  Medications   escitalopram (LEXAPRO) 10 MG tablet    Sig: Take 1 tablet (10 mg total) by mouth daily.    Dispense:  90 tablet    Refill:  3    Add refills for future      Follow up plan: Return in about 1 year (around 10/25/2023) for 1 year Annual Physical.  Saralyn Pilar, DO [facility] 10/24/2022, 9:29 AM

## 2022-10-24 NOTE — Patient Instructions (Addendum)
Thank you for coming to the office today.  Future reconsider HPV Vaccine, 3 dose series, can do anytime, nurse visit.  Continue current treatment plan with Buspar as needed and Lexapro every day.  The forgetfulness is quite normal. I do think it is related to mind/anxiety/ADHD possibly  If we can establish with someone in therapy they can help [place] with further ADHD testing.  These offices have both PSYCHIATRY doctors and THERAPISTS  [facility] (Virtual Available) [place]  [address] [place] [address] [place], [place] [address] Phone: [phone]  [facility] Address: [address] [place], [place] [address] [email or web address] Phone: [phone]  [facility]) Address: [address], [place], [place] [address] Hours: 8:30AM-5PM Phone: [phone]  [facility] (Adult, Peds, Geriatric, Counseling) [address] [place], [place] [address] Phone: [phone] Fax: [phone]  [facility] at [facility] [address] [place], [place] [address] Phone: [phone]  [facility] (All ages) [address], [place], [id number] Phone: [phone] (Option 1) [email or web address]  ----------------------------------------------------------------- THERAPIST ONLY  (No Psychiatry)  [facility] [address] [place], [place] [address] [place] P: [phone]  [facility]) [facility], [facility] [address] [place], [place] [address] [phone]  [facility]   Address: [address] [place], [place], [place] [address] Hours: Open today  9AM-7PM Phone: [phone]  [facility] [address] [facility]  - [facility] Center Address: [address], [place], [place] [address] Phone: [phone]    Please schedule a  Follow-up Appointment to: Return in about 1 year (around 10/25/2023) for 1 year Annual Physical.  If you have any other questions or concerns, please feel free to call the office or send a message through MyChart. You may also schedule an earlier appointment if necessary.  Additionally, you may be receiving a survey about your experience at our office within a few days to 1 week by e-mail or mail. We value your feedback.  Saralyn Pilar, DO [facility], [place]

## 2022-11-03 ENCOUNTER — Encounter: Payer: Self-pay | Admitting: Family Medicine

## 2022-11-03 DIAGNOSIS — F331 Major depressive disorder, recurrent, moderate: Secondary | ICD-10-CM

## 2022-11-03 DIAGNOSIS — R059 Cough, unspecified: Secondary | ICD-10-CM

## 2022-11-03 DIAGNOSIS — F411 Generalized anxiety disorder: Secondary | ICD-10-CM

## 2022-11-06 NOTE — Addendum Note (Signed)
Addended by: Smitty Cords on: 11/06/2022 02:40 PM   Modules accepted: Orders

## 2022-12-11 ENCOUNTER — Ambulatory Visit: Payer: BC Managed Care – PPO | Admitting: Family Medicine

## 2022-12-11 ENCOUNTER — Encounter: Payer: Self-pay | Admitting: Family Medicine

## 2022-12-11 VITALS — BP 102/64 | HR 70 | Ht 70.0 in | Wt 115.8 lb

## 2022-12-11 DIAGNOSIS — M25512 Pain in left shoulder: Secondary | ICD-10-CM

## 2022-12-11 DIAGNOSIS — S29011A Strain of muscle and tendon of front wall of thorax, initial encounter: Secondary | ICD-10-CM

## 2022-12-11 DIAGNOSIS — M62838 Other muscle spasm: Secondary | ICD-10-CM

## 2022-12-11 NOTE — Patient Instructions (Addendum)
Thank you for coming to the office today.  The pain is not heart related! Keep on current anxiety therapy  It seems to be due to muscle strain spasm with heavy lifting using arms only and can be related to neck and pectoral muscle radiating into arm pit.  Try to stretch both neck and chest / shoulder and stay mobile, avoid prolonged heavy lifting or fixed position.  Use topical heat , ice, muscle rub  We can consider Sports Med in future if want to do consult on hypermobility and shoulder  Recommend OTC Debrox Ear Wax Removal kit to flush ears. It comes with a syringe as well.   Please schedule a Follow-up Appointment to: No follow-ups on file.  If you have any other questions or concerns, please feel free to call the office or send a message through [place]. You may also schedule an earlier appointment if necessary.  Additionally, you may be receiving a survey about your experience at our office within a few days to 1 week by e-mail or mail. We value your feedback.  Nobie Putnam, DO [facility], [facility]  Range of Motion Shoulder Exercises  [place] with your good arm against a counter or table for support [facility] forward with a wide stance (make sure your body is comfortable) - Your painful shoulder should hang down and feel "heavy" - Gently move your painful arm in small circles "clockwise" for several turns - Switch to "counterclockwise" for several turns - Early on keep circles narrow and move slowly - Later in rehab, move in larger circles and faster movement   Wall Crawl - Stand close (about 1-2 ft away) to a wall, facing it directly - Reach out with your arm of painful shoulder and place fingers (not palm) on wall - You should make contact with wall at your waist level - Slowly walk your fingers up the wall. Stay in contact with wall entire time, do not remove fingers - Keep walking fingers up wall until you reach shoulder level -  You may feel tightening or mild discomfort, once you reach a height that causes pain or if you are already above your shoulder height then stop. Repeat from starting position. - Early on stand closer to wall, move fingers slowly, and stay at or below shoulder level - Later in rehab, stand farther away from wall (fingertips), move fingers quicker, go above shoulder level

## 2022-12-11 NOTE — Progress Notes (Signed)
Subjective:    Patient ID: Shane Gray, male    DOB: Sep 29, 2001, 22 y.o.   MRN: [id number]  Shane Gray is a 22 y.o. male presenting on 12/11/2022 for Shoulder Pain   HPI  L Neck Pain spasm Left Anterior Shoulder Pain  2-3 nights ago he was playing a video and got anxious and said heart was racing Driving with arms up and at work some raised arm motions and lifting with repetitive motion Neck turning to left will have some spasm and difficulty moving and muscle tension He says some pain to touch with the pectoral muscle and feels strained at times, radiating pain into axillary No actual chest pain or sweating or dyspnea or other concerning symptoms  Generalized Anxiety Disorder - with specific social triggers Major Depression, recurrent moderate Possible ADHD   Interval update since last visit 08/2022 started on Buspar 47m [place] needed, has done well taking it occasionally. It is helpful to reduce anxiety.   Overall anxiety is improving overall. It is currently manageable and eating well sleeping well   Continues Escitalopram 161mdaily       12/11/2022   10:23 AM 10/24/2022    9:14 AM 09/03/2022    9:40 AM  Depression screen PHQ 2/9  Decreased Interest _0 Down, Depressed, Hopeless _1 PHQ - 2 Score _2 Altered sleeping _3 Tired, decreased energy _4 Change in appetite _5 Feeling bad or failure about yourself  _6 Trouble concentrating _7 Moving slowly or fidgety/restless _8 Suicidal thoughts 0 1 1  PHQ-9 Score _9 Difficult doing work/chores Somewhat difficult Somewhat difficult Very difficult    Social History   Tobacco Use   Smoking status: Never   Smokeless tobacco: Never  Vaping Use   Vaping Use: Never used  Substance Use Topics   Alcohol use: Yes    Alcohol/week: 2.0 standard drinks of alcohol    Types: 1 Cans of beer, 1 Standard drinks or equivalent per week   Drug use: Yes    Types:  Marijuana    Comment: delta 8, last used used 11/28/21    Review of Systems Per HPI unless specifically indicated above     Objective:    BP 102/64   Pulse 70   Ht 5' 10" (1.778 m)   Wt 115 lb 12.8 oz (52.5 kg)   SpO2 99%   BMI 16.62 kg/m   Wt Readings from Last 3 Encounters:  12/11/22 115 lb 12.8 oz (52.5 kg)  10/24/22 117 lb 3.2 oz (53.2 kg)  09/03/22 114 lb 3.2 oz (51.8 kg)    Physical Exam Vitals and nursing note reviewed.  Constitutional:      General: He is not in acute distress.    Appearance: Normal appearance. He is well-developed. He is not diaphoretic.     Comments: Well-appearing, comfortable, cooperative  HENT:     Head: Normocephalic and atraumatic.  Eyes:     General:        Right eye: No discharge.        Left eye: No discharge.     Conjunctiva/sclera: Conjunctivae normal.  Cardiovascular:     Rate and Rhythm: Normal rate.  Pulmonary:     Effort: Pulmonary effort is normal.  Musculoskeletal:     Comments: Left pectoral muscle appears normal  but has some mild tenderness on deeper palpation, radiation of pain into axillary region L Shoulder ROM is full intact has some mild discomfort at peak ranges of motion and impingement testing is positive for pain. No weakness detected.  Neck paraspinal muscles and SCM intact without hypertonicity spasm at this time currently has full ROM  Skin:    General: Skin is warm and dry.     Findings: No erythema or rash.  Neurological:     Mental Status: He is alert and oriented to person, place, and time.  Psychiatric:        Mood and Affect: Mood normal.        Behavior: Behavior normal.        Thought Content: Thought content normal.     Comments: Well groomed, good eye contact, normal speech and thoughts    Results for orders placed or performed in visit on 11/28/21  HIV Antibody (routine testing w rflx)  Result Value Ref Range   HIV 1&2 Ab, 4th Generation NON-REACTIVE NON-REACTIVE  RPR  Result Value Ref  Range   RPR Ser Ql NON-REACTIVE NON-REACTIVE  GC/Chlamydia probe amp ([place])not at [facility]  Result Value Ref Range   Neisseria Gonorrhea Negative    Chlamydia Negative    Comment Normal Reference Ranger Chlamydia - Negative    Comment      Normal Reference Range Neisseria Gonorrhea - Negative      Assessment & Plan:   Problem List Items Addressed This Visit   None Visit Diagnoses     Acute pain of left shoulder    -  Primary   Strain of left pectoralis muscle, initial encounter       Muscle spasms of neck           Clinically L sided pectoral muscle strain likely repetitive lifting at work and video games / driving with arms elevated locked position.  He has a thin frame and hypermobility may have some MSK pains related to this.  Unlikely cardiac related chest pain as his concern was. His anxiety is contributing to the heart racing and concern with this symptom  It seems to be due to muscle strain spasm with heavy lifting using arms only and can be related to neck and pectoral muscle radiating into arm pit.  Try to stretch both neck and chest / shoulder and stay mobile, avoid prolonged heavy lifting or fixed position.  Use topical heat , ice, muscle rub  We can consider Sports Med in future if want to do consult on hypermobility and shoulder  Recommend OTC Debrox Ear Wax Removal kit to flush ears. It comes with a syringe as well.  No orders of the defined types were placed in this encounter.     Follow up plan: Return if symptoms worsen or fail to improve.    Shane Gray, [facility] 12/11/2022, 10:27 AM

## 2022-12-20 ENCOUNTER — Encounter: Payer: Self-pay | Admitting: Family Medicine

## 2023-01-16 ENCOUNTER — Encounter: Payer: Self-pay | Admitting: Family Medicine

## 2023-01-16 ENCOUNTER — Ambulatory Visit: Payer: BC Managed Care – PPO | Admitting: Family Medicine

## 2023-01-16 VITALS — BP 100/80 | HR 71 | Ht 70.0 in | Wt 116.8 lb

## 2023-01-16 DIAGNOSIS — F331 Major depressive disorder, recurrent, moderate: Secondary | ICD-10-CM | POA: Diagnosis not present

## 2023-01-16 DIAGNOSIS — R0789 Other chest pain: Secondary | ICD-10-CM

## 2023-01-16 DIAGNOSIS — R0781 Pleurodynia: Secondary | ICD-10-CM | POA: Diagnosis not present

## 2023-01-16 DIAGNOSIS — R059 Cough, unspecified: Secondary | ICD-10-CM

## 2023-01-16 DIAGNOSIS — F411 Generalized anxiety disorder: Secondary | ICD-10-CM

## 2023-01-16 NOTE — Patient Instructions (Addendum)
Thank you for coming to the office today.  X-ray here in our office, walk in first come first serve Monday - Thurs 8am to 4ish  Closed for lunch 12-1  Will reach you with results later that day or next day  May consider Albuterol rescue inhaler  Consider Sports Medicine consultation on the ribs  Also increase Lexapro from 10 to 6m daily. Double your 174m same time, and message me if you prefer the 2063m can re order.  Please schedule a Follow-up Appointment to: Return if symptoms worsen or fail to improve.  If you have any other questions or concerns, please feel free to call the office or send a message through [facility] may also schedule an earlier appointment if necessary.  Additionally, you may be receiving a survey about your experience at our office within a few days to 1 week by e-mail or mail. We value your feedback.  AleNobie PutnamO [place]

## 2023-01-16 NOTE — Progress Notes (Signed)
Subjective:    Patient ID: Shane Gray, male    DOB: 06-24-2001, 22 y.o.   MRN: [id number]  Shane Gray is a 22 y.o. male presenting on 01/16/2023 for Anxiety and chest discomfort   HPI  Chest Wall Pain Ribs / Left Shoulder Pain  Reports 2 weeks ago with generalized bilateral rib cage discomfort lower and upper, and also Left sided. Mild aching while resting and inactive, but more moderate to severe pain with movement.  Similar issue previously with Left Shoulder pain as discussed  He has tried MSK treatment OTC options mixed results. He does lifting at work.  His main concern now is with breathing, if he is not able to catch his breath. He does attribute some to worry and anxiety   Generalized Anxiety Disorder - with specific social triggers Major Depression, recurrent moderate Possible ADHD   Interval update since last visit 08/2022 started on Buspar 69m [place] needed, has done well taking it occasionally. It is helpful to reduce anxiety.   Overall anxiety is improving overall. It is currently manageable and eating well sleeping well   Continues Escitalopram 17mdaily he asks about dose increase     01/16/2023    9:15 AM 12/11/2022   10:23 AM 10/24/2022    9:14 AM  Depression screen PHQ 2/9  Decreased Interest 2 1 2  $ Down, Depressed, Hopeless 0 1 1  PHQ - 2 Score 2 2 3  $ Altered sleeping 3 1 3  $ Tired, decreased energy 3 2 3  $ Change in appetite 3 1 2  $ Feeling bad or failure about yourself  0 1 1  Trouble concentrating 3 2 2  $ Moving slowly or fidgety/restless 3 3 2  $ Suicidal thoughts 0 0 1  PHQ-9 Score 17 12 17  $ Difficult doing work/chores Extremely dIfficult Somewhat difficult Somewhat difficult    Social History   Tobacco Use   Smoking status: Never   Smokeless tobacco: Never  Vaping Use   Vaping Use: Never used  Substance Use Topics   Alcohol use: Yes    Alcohol/week: 2.0 standard drinks of alcohol    Types: 1 Cans of beer, 1  Standard drinks or equivalent per week   Drug use: Yes    Types: Marijuana    Comment: delta 8, last used used 11/28/21    Review of Systems Per HPI unless specifically indicated above     Objective:    BP 100/80   Pulse 71   Ht 5' 10"$  (1.778 m)   Wt 116 lb 12.8 oz (53 kg)   SpO2 99%   BMI 16.76 kg/m   Wt Readings from Last 3 Encounters:  01/16/23 116 lb 12.8 oz (53 kg)  12/11/22 115 lb 12.8 oz (52.5 kg)  10/24/22 117 lb 3.2 oz (53.2 kg)    Physical Exam Vitals and nursing note reviewed.  Constitutional:      General: He is not in acute distress.    Appearance: Normal appearance. He is well-developed. He is not diaphoretic.     Comments: Well-appearing, comfortable, cooperative  HENT:     Head: Normocephalic and atraumatic.  Eyes:     General:        Right eye: No discharge.        Left eye: No discharge.     Conjunctiva/sclera: Conjunctivae normal.  Cardiovascular:     Rate and Rhythm: Normal rate.  Pulmonary:     Effort: Pulmonary effort is normal. No respiratory  distress.     Breath sounds: Normal breath sounds. No wheezing, rhonchi or rales.  Chest:     Chest wall: No tenderness.  Skin:    General: Skin is warm and dry.     Findings: No erythema or rash.  Neurological:     Mental Status: He is alert and oriented to person, place, and time.  Psychiatric:        Mood and Affect: Mood normal.        Behavior: Behavior normal.        Thought Content: Thought content normal.     Comments: Well groomed, good eye contact, normal speech and thoughts    Results for orders placed or performed in visit on 11/28/21  HIV Antibody (routine testing w rflx)  Result Value Ref Range   HIV 1&2 Ab, 4th Generation NON-REACTIVE NON-REACTIVE  RPR  Result Value Ref Range   RPR Ser Ql NON-REACTIVE NON-REACTIVE  GC/Chlamydia probe amp ([place])not at [facility]  Result Value Ref Range   Neisseria Gonorrhea Negative    Chlamydia Negative    Comment Normal Reference Ranger  Chlamydia - Negative    Comment      Normal Reference Range Neisseria Gonorrhea - Negative      Assessment & Plan:   Problem List Items Addressed This Visit     GAD (generalized anxiety disorder)   Relevant Medications   escitalopram (LEXAPRO) 10 MG tablet   Major depressive disorder, recurrent episode, moderate (HCC)   Relevant Medications   escitalopram (LEXAPRO) 10 MG tablet   Other Visit Diagnoses     Chest wall pain    -  Primary   Relevant Orders   DG Chest 2 View   Rib pain       Relevant Orders   DG Chest 2 View   Cough, unspecified type       Relevant Orders   DG Chest 2 View       Most suggestive of MSK etiology with bilateral various rib chest wall symptoms of pain, seems very linked to activity and movements, lifting, deep breathing. Seems to have some pleuritic component. But breathing is not clinically limited. No asthma  May be anxiety component as well, given his history of anxiety.  Chest X-ray ordered. [facility] here will return on Monday for X-ray before 11am  Will reach you with results later that day or next day  May consider Albuterol rescue inhaler  Consider Sports Medicine consultation on the L shoulder and MSK symptoms.  Also increase Lexapro from 10 to 40m daily. Double your 160m same time, and message me if you prefer the 2064m can re order.  Orders Placed This Encounter  Procedures   DG Chest 2 View    Standing Status:   Future    Standing Expiration Date:   07/17/2023    Order Specific Question:   Reason for Exam (SYMPTOM  OR DIAGNOSIS REQUIRED)    Answer:   chest wall pain 2 weeks bilateral rib and cough at times, patient at risk for spontaneous PTX    Order Specific Question:   Preferred imaging location?    Answer:   [facility]  No orders of the defined types were placed in this encounter.     Follow up plan: Return if symptoms worsen or fail to improve.   AleNobie PutnamO [facility] 01/16/2023, 9:17 AM

## 2023-03-26 ENCOUNTER — Other Ambulatory Visit: Payer: Self-pay | Admitting: Family Medicine

## 2023-03-26 DIAGNOSIS — F411 Generalized anxiety disorder: Secondary | ICD-10-CM

## 2023-03-26 DIAGNOSIS — F331 Major depressive disorder, recurrent, moderate: Secondary | ICD-10-CM

## 2023-03-27 MED ORDER — ESCITALOPRAM OXALATE 20 MG PO TABS
20.0000 mg | ORAL_TABLET | Freq: Every day | ORAL | 1 refills | Status: DC
Start: 2023-03-27 — End: 2023-06-10
  Filled 2023-03-27: qty 30, 30d supply, fill #0

## 2023-03-29 ENCOUNTER — Other Ambulatory Visit: Payer: Self-pay

## 2023-03-30 DIAGNOSIS — R0789 Other chest pain: Secondary | ICD-10-CM | POA: Diagnosis not present

## 2023-03-30 DIAGNOSIS — M940 Chondrocostal junction syndrome [Tietze]: Secondary | ICD-10-CM | POA: Diagnosis not present

## 2023-03-31 ENCOUNTER — Ambulatory Visit: Payer: BC Managed Care – PPO | Admitting: Family Medicine

## 2023-04-14 ENCOUNTER — Other Ambulatory Visit: Payer: Self-pay

## 2023-05-22 ENCOUNTER — Ambulatory Visit
Admission: EM | Admit: 2023-05-22 | Discharge: 2023-05-22 | Disposition: A | Discharge status: Home / Self Care | Payer: Worker's Compensation | Attending: Emergency Medicine | Admitting: Emergency Medicine

## 2023-05-22 DIAGNOSIS — T300 Burn of unspecified body region, unspecified degree: Secondary | ICD-10-CM | POA: Diagnosis not present

## 2023-05-22 MED ORDER — IBUPROFEN 600 MG PO TABS
600.0000 mg | ORAL_TABLET | Freq: Once | ORAL | Status: AC
  Administered 2023-05-22: 600 mg via ORAL
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
## 2023-06-09 ENCOUNTER — Ambulatory Visit: Payer: Self-pay | Admitting: *Deleted

## 2023-06-09 NOTE — Telephone Encounter (Signed)
Reason for Disposition  [1] After 2 weeks AND [2] still painful  Answer Assessment - Initial Assessment Questions 1. MECHANISM: "How did the injury happen?"     It got scabby and dry and it's peeling, bleeding and it's pussy.   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   It's raw and burns. 2. ONSET: "When did the injury happen?" (Minutes or hours ago)      It started getting purple and started peeling. Started peeling a few days ago.   The discoloration a month now.    My fingers have bad eczema on them.   My fingers are red and have a rash also like when I was a kid and had eczema.   3. APPEARANCE of INJURY: "What does the injury look like?"      It's not swollen.   It's red and has puss coming out of my hand.   It's a clear pus.   It's peeling.   4. SEVERITY: "Can you use the hand normally?" "Can you bend your fingers into a ball and then fully open them?"     Yes I can but it hurts. 5. SIZE: For cuts, bruises, or swelling, ask: "How large is it?" (e.g., inches or centimeters;  entire hand or wrist)      The discoloration is purple and red.   Just part of my hand is purple.   The palm is involved. 6. PAIN: "Is there pain?" If Yes, ask: "How bad is the pain?"  (Scale 1-10; or mild, moderate, severe)     Yes 7. TETANUS: For any breaks in the skin, ask: "When was the last tetanus booster?"     Not asked 8. OTHER SYMPTOMS: "Do you have any other symptoms?"      See above 9. PREGNANCY: "Is there any chance you are pregnant?" "When was your last menstrual period?"     N/A  Protocols used: Hand and Wrist Injury-A-AH

## 2023-06-09 NOTE — Telephone Encounter (Signed)
  Chief Complaint: Right hand is peeling, painful and purple.  Fingers look like he has eczema like when he was a kid. Symptoms:  Dry, scabby and peeling, bleeding and has clear puss coming out.   It's raw and burning mainly my palm is involved.    Frequency: Peeling started a few days ago but the purple discoloration has been going on for a month Pertinent Negatives: Patient denies injuries but does have a history of eczema.   Disposition: [] ED /[] Urgent Care (no appt availability in office) / [x] Appointment(In office/virtual)/ []  [facility]/ [] Home Care/ [] Refused Recommended Disposition /[] [facility]/ []  Follow-up with PCP Additional Notes: Appt made with Dr. Althea Charon for 06/10/2023 at 3:20.

## 2023-06-10 ENCOUNTER — Encounter: Payer: Self-pay | Admitting: Family Medicine

## 2023-06-10 ENCOUNTER — Ambulatory Visit: Payer: BC Managed Care – PPO | Admitting: Family Medicine

## 2023-06-10 VITALS — BP 102/60 | HR 71 | Temp 98.5°F | Resp 17 | Ht 70.0 in | Wt 120.8 lb

## 2023-06-10 DIAGNOSIS — L301 Dyshidrosis [pompholyx]: Secondary | ICD-10-CM | POA: Diagnosis not present

## 2023-06-10 DIAGNOSIS — F411 Generalized anxiety disorder: Secondary | ICD-10-CM | POA: Diagnosis not present

## 2023-06-10 MED ORDER — TRIAMCINOLONE ACETONIDE 0.5 % EX CREA: 1.0000 | TOPICAL_CREAM | Freq: Two times a day (BID) | CUTANEOUS | 2 refills | Status: DC

## 2023-06-10 MED ORDER — ESCITALOPRAM OXALATE 10 MG PO TABS: 10.0000 mg | ORAL_TABLET | Freq: Every day | ORAL | 1 refills | Status: DC

## 2023-06-10 NOTE — Progress Notes (Signed)
Subjective:    Patient ID: Shane Gray, male    DOB: 01-Dec-2001, 22 y.o.   MRN: [id number]  Shane Gray is a 22 y.o. male presenting on 06/10/2023 for Hand Problem (Peeling on the Rt inner hand w/ discoloration x 1 mth. Painful to the touch. He describe it as a burning sensation. He is unsure if the rash could be caused by wearing latex powder gloves at work. )   HPI  Dyshidrotic Eczema Reports Frequent hand washing recently. He has dry skin patches on hands and superficial skin slough with sore on palms. Using antibiotic ointment.  Anxiety He is doing well needs re order Refill Escitalopram 10mg   Still has Rib sensitivity with coughing.      06/10/2023    3:30 PM 01/16/2023    9:15 AM 12/11/2022   10:23 AM  Depression screen PHQ 2/9  Decreased Interest 1 2 1   Down, Depressed, Hopeless 1 0 1  PHQ - 2 Score 2 2 2   Altered sleeping 3 3 1   Tired, decreased energy 2 3 2   Change in appetite 2 3 1   Feeling bad or failure about yourself  1 0 1  Trouble concentrating 2 3 2   Moving slowly or fidgety/restless 3 3 3   Suicidal thoughts 0 0 0  PHQ-9 Score 15 17 12   Difficult doing work/chores Somewhat difficult Extremely dIfficult Somewhat difficult      06/10/2023    3:30 PM 01/16/2023    9:16 AM 12/11/2022   10:23 AM 10/24/2022    9:14 AM  GAD 7 : Generalized Anxiety Score  Nervous, Anxious, on Edge 3 3 2 2   Control/stop worrying 3 3 2 2   Worry too much - different things 3 3 2 2   Trouble relaxing 2 3 2 2   Restless 2 3 2 2   Easily annoyed or irritable 3 3 2 2   Afraid - awful might happen 3 3 2 2   Total GAD 7 Score 19 21 14 14   Anxiety Difficulty Not difficult at all Somewhat difficult Somewhat difficult Very difficult      Social History   Tobacco Use   Smoking status: Never   Smokeless tobacco: Never  Vaping Use   Vaping Use: Never used  Substance Use Topics   Alcohol use: Yes    Alcohol/week: 2.0 standard drinks of alcohol    Types: 1 Cans of beer, 1  Standard drinks or equivalent per week   Drug use: Yes    Types: Marijuana    Comment: delta 8, last used used 11/28/21    Review of Systems Per HPI unless specifically indicated above     Objective:    BP 102/60 (BP Location: Left Arm, Patient Position: Sitting, Cuff Size: Normal)   Pulse 71   Temp 98.5 F (36.9 C) (Oral)   Resp 17   Ht 5\' 10"  (1.778 m)   Wt 120 lb 12.8 oz (54.8 kg)   SpO2 98%   BMI 17.33 kg/m   Wt Readings from Last 3 Encounters:  06/10/23 120 lb 12.8 oz (54.8 kg)  05/22/23 116 lb (52.6 kg)  01/16/23 116 lb 12.8 oz (53 kg)    Physical Exam Vitals and nursing note reviewed.  Constitutional:      General: He is not in acute distress.    Appearance: Normal appearance. He is well-developed. He is not diaphoretic.     Comments: Well-appearing, comfortable, cooperative  HENT:     Head: Normocephalic and atraumatic.  Eyes:  General:        Right eye: No discharge.        Left eye: No discharge.     Conjunctiva/sclera: Conjunctivae normal.  Cardiovascular:     Rate and Rhythm: Normal rate.  Pulmonary:     Effort: Pulmonary effort is normal.  Skin:    General: Skin is warm and dry.     Findings: Rash (bilateral hands with dry irritated patches of rough skin, hands very dry, palm R side with superficial skin sore) present. No erythema.  Neurological:     Mental Status: He is alert and oriented to person, place, and time.  Psychiatric:        Mood and Affect: Mood normal.        Behavior: Behavior normal.        Thought Content: Thought content normal.     Comments: Well groomed, good eye contact, normal speech and thoughts      Results for orders placed or performed in visit on 11/28/21  HIV Antibody (routine testing w rflx)  Result Value Ref Range   HIV 1&2 Ab, 4th Generation NON-REACTIVE NON-REACTIVE  RPR  Result Value Ref Range   RPR Ser Ql NON-REACTIVE NON-REACTIVE  GC/Chlamydia probe amp ([place])not at [facility]  Result Value Ref  Range   Neisseria Gonorrhea Negative    Chlamydia Negative    Comment Normal Reference Ranger Chlamydia - Negative    Comment      Normal Reference Range Neisseria Gonorrhea - Negative      Assessment & Plan:   Problem List Items Addressed This Visit     GAD (generalized anxiety disorder)   Relevant Medications   escitalopram (LEXAPRO) 10 MG tablet   Other Visit Diagnoses     Dyshidrotic hand dermatitis    -  Primary   Relevant Medications   triamcinolone cream (KENALOG) 0.5 %       Consistent with 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  Plan: 1. Rx Triamcinolone cream 0.5% TWICE A DAY 1-2 weeks 2. Start daily moisturizer BID 3. Counseled on routine eczema prevention/skin care per AVS 4. Return criteria  #Anxiety Refill Escitalopram   Meds ordered this encounter  Medications   triamcinolone cream (KENALOG) 0.5 %    Sig: Apply 1 Application topically 2 (two) times daily. To affected areas, for up to 2 weeks.    Dispense:  30 g    Refill:  2   escitalopram (LEXAPRO) 10 MG tablet    Sig: Take 1 tablet (10 mg total) by mouth daily.    Dispense:  90 tablet    Refill:  1      Follow up plan: Return if symptoms worsen or fail to improve.   Saralyn Pilar, DO [facility] 06/10/2023, 3:42 PM

## 2023-06-10 NOTE — Patient Instructions (Addendum)
Thank you for coming to the office today.  Refilled Escitalopram 10mg  daily.   The rash looks most consistent with eczema, this can flare up and get worse due to a variety of factors (excessive dry skin from bathing/showering, soaps, cold weather / indoor heaters, outdoor exposures).  Use the topical steroid creams twice a day for up to 1 week, maximum duration of use per one flare is 10 to 14 days, then STOP using it and allow skin to recover. Caution with over-use may cause lightening of the skin.   Tips to reduce Eczema Flares: For baths/showers, limit bathing to every other day if you can (max 1 x daily)  Use a gentle, unscented soap and lukewarm water (hot water is most irritating to skin) Never scrub skin with too much pressure, this causes more irritation. Pat skin dry, then leave it slightly damp. DO NOT scrub it dry. Apply steroid cream to skin and rub in all the way, wait 15-30 min, then apply a daily moisturizer (Lubriderm, Eucerin, Aveeno). Continue daily moisturizer every day of the year (even after flare is resolved) - If you have eczema on hands or dry hands, recommend wearing any type of gloves overnight (cloth, fabric, or even nitrile/latex) to improve effect of topical moisturizer  If develops redness, honey colored crust oozing, drainage of pus, bleeding, or redness / swelling, pain, please return for re-evaluation, may have become infected after scratching.   Please schedule a Follow-up Appointment to: Return if symptoms worsen or fail to improve.  If you have any other questions or concerns, please feel free to call the office or send a message through MyChart. You may also schedule an earlier appointment if necessary.  Additionally, you may be receiving a survey about your experience at our office within a few days to 1 week by e-mail or mail. We value your feedback.  Saralyn Pilar, DO [facility], [place]

## 2023-07-24 ENCOUNTER — Ambulatory Visit: Payer: BC Managed Care – PPO | Admitting: Family Medicine

## 2023-07-24 ENCOUNTER — Encounter: Payer: Self-pay | Admitting: Family Medicine

## 2023-07-24 VITALS — BP 110/72 | HR 64 | Temp 96.9°F | Ht 70.0 in | Wt 122.6 lb

## 2023-07-24 DIAGNOSIS — M25512 Pain in left shoulder: Secondary | ICD-10-CM

## 2023-07-24 DIAGNOSIS — G8929 Other chronic pain: Secondary | ICD-10-CM

## 2023-07-24 DIAGNOSIS — R202 Paresthesia of skin: Secondary | ICD-10-CM | POA: Diagnosis not present

## 2023-07-24 DIAGNOSIS — R0781 Pleurodynia: Secondary | ICD-10-CM

## 2023-07-24 DIAGNOSIS — M62838 Other muscle spasm: Secondary | ICD-10-CM

## 2023-07-24 MED ORDER — BACLOFEN 10 MG PO TABS
5.0000 mg | ORAL_TABLET | Freq: Two times a day (BID) | ORAL | 1 refills | Status: DC | PRN
Start: 2023-07-24 — End: 2023-10-14

## 2023-07-24 NOTE — Progress Notes (Signed)
Subjective:    Patient ID: Shane Gray, male    DOB: 19-Dec-2000, 22 y.o.   MRN: [id number]  Shane Gray is a 22 y.o. male presenting on 07/24/2023 for Chest Pain (Left side only can radiate to ribs. As of today no pain at the moment) and Numbness (When pain is happening on left side of chest pt feels his left arm weak/numb. Onset for a month comes and goes)   HPI  Left Upper Extremity Paresthesia Left sided Chest Wall / Rib Pain Similar prior chronic issues that have bothered him. We have discussed before with MSK symptoms He has been on NSAID and Baclofen before, no longer on He does heavy lifting with work Recently admits some symptoms Left shoulder with paresthesia numbness tingling weakness L side into arm and hand  Similar issue previously with Left Shoulder pain as discussed       07/24/2023   10:24 AM 06/10/2023    3:30 PM 01/16/2023    9:15 AM  Depression screen PHQ 2/9  Decreased Interest 1 1 2   Down, Depressed, Hopeless 2 1 0  PHQ - 2 Score 3 2 2   Altered sleeping 3 3 3   Tired, decreased energy 3 2 3   Change in appetite 2 2 3   Feeling bad or failure about yourself  2 1 0  Trouble concentrating 1 2 3   Moving slowly or fidgety/restless 2 3 3   Suicidal thoughts 0 0 0  PHQ-9 Score 16 15 17   Difficult doing work/chores Somewhat difficult Somewhat difficult Extremely dIfficult    Social History   Tobacco Use   Smoking status: Never   Smokeless tobacco: Never  Vaping Use   Vaping status: Never Used  Substance Use Topics   Alcohol use: Yes    Alcohol/week: 2.0 standard drinks of alcohol    Types: 1 Cans of beer, 1 Standard drinks or equivalent per week   Drug use: Yes    Types: Marijuana    Comment: delta 8, last used used 11/28/21    Review of Systems Per HPI unless specifically indicated above     Objective:    BP 110/72   Pulse 64   Temp (!) 96.9 F (36.1 C) (Temporal)   Ht 5\' 10"  (1.778 m)   Wt 122 lb 9.6 oz (55.6 kg)   SpO2 99%    BMI 17.59 kg/m   Wt Readings from Last 3 Encounters:  07/24/23 122 lb 9.6 oz (55.6 kg)  06/10/23 120 lb 12.8 oz (54.8 kg)  05/22/23 116 lb (52.6 kg)    Physical Exam Vitals and nursing note reviewed.  Constitutional:      General: He is not in acute distress.    Appearance: He is well-developed. He is not diaphoretic.     Comments: Well-appearing, comfortable, cooperative  HENT:     Head: Normocephalic and atraumatic.  Eyes:     General:        Right eye: No discharge.        Left eye: No discharge.     Conjunctiva/sclera: Conjunctivae normal.  Neck:     Thyroid: No thyromegaly.  Cardiovascular:     Rate and Rhythm: Normal rate and regular rhythm.     Pulses: Normal pulses.     Heart sounds: Normal heart sounds. No murmur heard. Pulmonary:     Effort: Pulmonary effort is normal. No respiratory distress.     Breath sounds: Normal breath sounds. No wheezing or rales.  Musculoskeletal:  General: Normal range of motion.     Cervical back: Normal range of motion and neck supple.     Comments: Muscle spasm trapezius left side, has full ROM of shoulder  Lymphadenopathy:     Cervical: No cervical adenopathy.  Skin:    General: Skin is warm and dry.     Findings: No erythema or rash.  Neurological:     General: No focal deficit present.     Mental Status: He is alert and oriented to person, place, and time. Mental status is at baseline.     Comments: Intact distal sensation  Psychiatric:        Behavior: Behavior normal.     Comments: Well groomed, good eye contact, normal speech and thoughts    Results for orders placed or performed in visit on 11/28/21  HIV Antibody (routine testing w rflx)  Result Value Ref Range   HIV 1&2 Ab, 4th Generation NON-REACTIVE NON-REACTIVE  RPR  Result Value Ref Range   RPR Ser Ql NON-REACTIVE NON-REACTIVE  GC/Chlamydia probe amp ([place])not at [facility]  Result Value Ref Range   Neisseria Gonorrhea Negative    Chlamydia Negative     Comment Normal Reference Ranger Chlamydia - Negative    Comment      Normal Reference Range Neisseria Gonorrhea - Negative      Assessment & Plan:   Problem List Items Addressed This Visit   None Visit Diagnoses     Chronic pain in left shoulder    -  Primary   Relevant Medications   baclofen (LIORESAL) 10 MG tablet   Other Relevant Orders   Ambulatory referral to Sports Medicine   Trapezius muscle spasm       Relevant Medications   baclofen (LIORESAL) 10 MG tablet   Other Relevant Orders   Ambulatory referral to Sports Medicine   Rib pain       Relevant Orders   Ambulatory referral to Sports Medicine   Paresthesia of left upper extremity       Relevant Orders   Ambulatory referral to Sports Medicine       Meds ordered this encounter  Medications   baclofen (LIORESAL) 10 MG tablet    Sig: Take 0.5-1 tablets (5-10 mg total) by mouth 2 (two) times daily as needed for muscle spasms.    Dispense:  30 each    Refill:  1   chronic Left shoulder pain and paresthesia in left upper extremity, also rib pain and muscle spasms. He has a thin body frame, and he does heavy lifting and repetitive work. We have discussed multiple occasions his MSK pains, seems to have costochondritis among other MSK pains difficult to diagnose. He has significant anxiety worrying him about these physical symptoms. He has improved on NSAID and Baclofen in the past, I will re order. He has been given instruction to avoid nerve entrapment if possible for limiting the paresthesias. He would benefit from further discussion, 2nd opinion, and may consider MSK [place] or other imaging and rehab for his Left Shoulder.  [facility]  Dr Joseph Berkshire  [facility] [address] [place], [place] [address] [phone]  They may consider additional imaging such as Ultrasound and X-rays, or other possibly MRI in the future.  Recommend using a towel or wrap for the elbow on Left arm  while sleeping to avoid pinching on the nerves, also wrist splint if you find a lot of hand / finger numbness.  Re ordered the Baclofen muscle relaxant, caution with sedation, take as needed up to 2-3 times per day as needed.  START anti inflammatory topical - OTC Voltaren (generic Diclofenac) topical 2-4 times a day as needed for pain swelling of affected joint for 1-2 weeks or longer.    Orders Placed This Encounter  Procedures   Ambulatory referral to Sports Medicine    Referral Priority:   Routine    Referral Type:   Consultation    Number of Visits Requested:   1      Follow up plan: Return if symptoms worsen or fail to improve.  Saralyn Pilar, DO [facility] 07/24/2023, 10:38 AM

## 2023-07-24 NOTE — Patient Instructions (Addendum)
Thank you for coming to the office today.  [facility]  Dr Joseph Berkshire  [facility] [address] [place], [place] [address] [phone]  They may consider additional imaging such as Ultrasound and X-rays, or other possibly MRI in the future.  Recommend using a towel or wrap for the elbow on Left arm while sleeping to avoid pinching on the nerves, also wrist splint if you find a lot of hand / finger numbness.  Re ordered the Baclofen muscle relaxant, caution with sedation, take as needed up to 2-3 times per day as needed.  START anti inflammatory topical - OTC Voltaren (generic Diclofenac) topical 2-4 times a day as needed for pain swelling of affected joint for 1-2 weeks or longer.   Please schedule a Follow-up Appointment to: Return if symptoms worsen or fail to improve.  If you have any other questions or concerns, please feel free to call the office or send a message through MyChart. You may also schedule an earlier appointment if necessary.  Additionally, you may be receiving a survey about your experience at our office within a few days to 1 week by e-mail or mail. We value your feedback.  Shane Pilar, DO [facility], [place]

## 2023-10-13 ENCOUNTER — Ambulatory Visit: Payer: BC Managed Care – PPO | Admitting: Family Medicine

## 2023-10-13 ENCOUNTER — Ambulatory Visit
Admission: RE | Admit: 2023-10-13 | Discharge: 2023-10-13 | Disposition: A | Discharge status: Home / Self Care | Payer: BC Managed Care – PPO | Source: Ambulatory Visit | Attending: Family Medicine

## 2023-10-13 ENCOUNTER — Ambulatory Visit
Admission: RE | Admit: 2023-10-13 | Discharge: 2023-10-13 | Disposition: A | Discharge status: Home / Self Care | Payer: BC Managed Care – PPO | Attending: Family Medicine | Admitting: Family Medicine

## 2023-10-13 VITALS — BP 120/74 | HR 83 | Ht 70.0 in | Wt 124.0 lb

## 2023-10-13 DIAGNOSIS — M9902 Segmental and somatic dysfunction of thoracic region: Secondary | ICD-10-CM

## 2023-10-13 DIAGNOSIS — G8929 Other chronic pain: Secondary | ICD-10-CM | POA: Diagnosis not present

## 2023-10-13 DIAGNOSIS — M7918 Myalgia, other site: Secondary | ICD-10-CM | POA: Insufficient documentation

## 2023-10-13 DIAGNOSIS — M438X6 Other specified deforming dorsopathies, lumbar region: Secondary | ICD-10-CM | POA: Diagnosis not present

## 2023-10-13 DIAGNOSIS — M9903 Segmental and somatic dysfunction of lumbar region: Secondary | ICD-10-CM | POA: Insufficient documentation

## 2023-10-13 DIAGNOSIS — M438X4 Other specified deforming dorsopathies, thoracic region: Secondary | ICD-10-CM | POA: Diagnosis not present

## 2023-10-13 DIAGNOSIS — M9901 Segmental and somatic dysfunction of cervical region: Secondary | ICD-10-CM

## 2023-10-13 DIAGNOSIS — M549 Dorsalgia, unspecified: Secondary | ICD-10-CM | POA: Diagnosis not present

## 2023-10-13 DIAGNOSIS — M542 Cervicalgia: Secondary | ICD-10-CM | POA: Diagnosis not present

## 2023-10-13 NOTE — Assessment & Plan Note (Signed)
Patient with roughly 2-year history of primarily thoracolumbar pain with focality to the inferior costal margins bilaterally and radiation into the lower lumbar, sacrum, and posterolateral hip regions.  He has noted radicular features in the past throughout bilateral lower extremities, most recently throughout the left upper extremity which has since resolved.  He has a thin frame and is required to perform strenuous lifting as part of his work related tasks on a consistent basis.  When asked about family history he relays that his mother had similar extensive musculoskeletal pain in her 30s though no formal diagnosis that he is aware of.  Examination with lordotic posture, thin frame, full, painful cervical, bilateral shoulders, lumbosacral, and bilateral hip range of motion with significant hamstring tightness, right greater than left.  Negative Spurling's and negative straight leg raise, RC testing with 5/5 strength, painful throughout, tenderness about the inferior costal margin, somewhat to the peristernal borders symmetrically, periscapular regions, lumbosacral regions.  He has positive Faber's, right greater than left, negative FADIR testing.  Differential can include upper and lower crossed syndrome, autoimmune conditions, myofascial pain syndrome, fibromyalgia.  These were all relayed to the patient.  Plan: - Obtain x-rays and labs today - Referral coordinator will contact to schedule physical therapy - Can dose nabumetone (anti-inflammatory) and baclofen (muscle relaxer) on an as-needed basis - Return for follow-up in 2 months - Contact us for any question/concerns between now and then - Pending lab and imaging workup, response to PT, can coordinate next steps.  Can consider advanced imaging and/or duloxetine for recalcitrant symptoms as clinically guided.

## 2023-10-13 NOTE — Progress Notes (Signed)
Primary Care / Sports Medicine Office Visit  Patient Information:  Patient ID: Shane Gray, male DOB: 04/14/2001 Age: 22 y.o. MRN: [id number]   Shane Gray is a pleasant 22 y.o. male presenting with the following:  Chief Complaint  Patient presents with   Rib Injury    Right and left sided. Started months ago. 2 years ago patient fell at work while not wearing slip resistant shoes. Hurts in back and wraps around into ribs. Patient works at [employer] and picks up heavy boxes all day.    Vitals:   10/13/23 0847  BP: 120/74  Pulse: 83  SpO2: 97%   Vitals:   10/13/23 0847  Weight: 124 lb (56.2 kg)  Height: 5\' 10"  (1.778 m)   Body mass index is 17.79 kg/m.  No results found.   Independent interpretation of notes and tests performed by another provider:   None  Procedures performed:   None  Pertinent History, Exam, Impression, and Recommendations:   Problem List Items Addressed This Visit       Other   Chronic musculoskeletal pain - Primary    Patient with roughly 2-year history of primarily thoracolumbar pain with focality to the inferior costal margins bilaterally and radiation into the lower lumbar, sacrum, and posterolateral hip regions.  He has noted radicular features in the past throughout bilateral lower extremities, most recently throughout the left upper extremity which has since resolved.  He has a thin frame and is required to perform strenuous lifting as part of his work related tasks on a consistent basis.  When asked about family history he relays that his mother had similar extensive musculoskeletal pain in her 30s though no formal diagnosis that he is aware of.  Examination with lordotic posture, thin frame, full, painful cervical, bilateral shoulders, lumbosacral, and bilateral hip range of motion with significant hamstring tightness, right greater than left.  Negative Spurling's and negative straight leg raise, RC testing with 5/5  strength, painful throughout, tenderness about the inferior costal margin, somewhat to the peristernal borders symmetrically, periscapular regions, lumbosacral regions.  He has positive Faber's, right greater than left, negative FADIR testing.  Differential can include upper and lower crossed syndrome, autoimmune conditions, myofascial pain syndrome, fibromyalgia.  These were all relayed to the patient.  Plan: - Obtain x-rays and labs today - Referral coordinator will contact to schedule physical therapy - Can dose nabumetone (anti-inflammatory) and baclofen (muscle relaxer) on an as-needed basis - Return for follow-up in 2 months - Contact us for any question/concerns between now and then - Pending lab and imaging workup, response to PT, can coordinate next steps.  Can consider advanced imaging and/or duloxetine for recalcitrant symptoms as clinically guided.      Relevant Orders   ANA 12Plus Profile, Do All RDL   DG Lumbar Spine Complete   DG Thoracic Spine W/Swimmers   DG Cervical Spine Complete   Ambulatory referral to Physical Therapy   Other Visit Diagnoses     Segmental and somatic dysfunction of cervical region       Relevant Orders   DG Cervical Spine Complete   Ambulatory referral to Physical Therapy   Segmental and somatic dysfunction of thoracic region       Relevant Orders   DG Thoracic Spine W/Swimmers   Ambulatory referral to Physical Therapy   Segmental and somatic dysfunction of lumbar region       Relevant Orders   DG Lumbar Spine Complete   Ambulatory  referral to Physical Therapy        Orders & Medications Medications: No orders of the defined types were placed in this encounter.  Orders Placed This Encounter  Procedures   DG Lumbar Spine Complete   DG Thoracic Spine W/Swimmers   DG Cervical Spine Complete   ANA 12Plus Profile, Do All RDL   Ambulatory referral to Physical Therapy     No follow-ups on file.     Jerrol Banana, MD, [facility]    Primary Care Sports Medicine Primary Care and Sports Medicine at [facility]

## 2023-10-13 NOTE — Patient Instructions (Signed)
-   Obtain x-rays and labs today - Referral coordinator will contact to schedule physical therapy - Can dose nabumetone (anti-inflammatory) and baclofen (muscle relaxer) on an as-needed basis - Return for follow-up in 2 months - Contact us for any question/concerns between now and then

## 2023-10-14 ENCOUNTER — Other Ambulatory Visit: Payer: Self-pay | Admitting: Family Medicine

## 2023-10-14 ENCOUNTER — Telehealth: Payer: Self-pay | Admitting: Family Medicine

## 2023-10-14 DIAGNOSIS — M62838 Other muscle spasm: Secondary | ICD-10-CM

## 2023-10-14 DIAGNOSIS — G8929 Other chronic pain: Secondary | ICD-10-CM

## 2023-10-14 MED ORDER — NABUMETONE 500 MG PO TABS: 500.0000 mg | ORAL_TABLET | Freq: Two times a day (BID) | ORAL | 0 refills | Status: DC | PRN

## 2023-10-14 MED ORDER — BACLOFEN 10 MG PO TABS: 5.0000 mg | ORAL_TABLET | Freq: Two times a day (BID) | ORAL | 0 refills | Status: DC | PRN

## 2023-10-14 NOTE — Telephone Encounter (Signed)
Called pt could not leave VM.  KP

## 2023-10-14 NOTE — Telephone Encounter (Signed)
Copied from CRM [id number]. Topic: General - Other >> Oct 13, 2023  5:11 PM Shane Gray wrote: Reason for CRM: The patient has called to follow up on previous discussions related to potential prescriptions of nabumetone (anti-inflammatory) and baclofen (muscle relaxer)    The patient would like to speak with a member of clinical staff when possible regarding the status of these prescriptions   Please contact further when available

## 2023-10-14 NOTE — Telephone Encounter (Signed)
Please review.  KP

## 2023-10-25 LAB — ANA 12PLUS PROFILE, DO ALL RDL
Anti-CCP Ab, IgG & IgA (RDL): <20 U (ref <20)
Anti-Cardiolipin Ab, IgA (RDL): <12 [APL'U]/mL (ref <12)
Anti-Cardiolipin Ab, IgG (RDL): <15 [GPL'U]/mL (ref <15)
Anti-Cardiolipin Ab, IgM (RDL): <13 [MPL'U]/mL (ref <13)
Anti-Centromere Ab (RDL): <1:40 {titer}
Anti-Chromatin Ab, IgG (RDL): <20 U (ref <20)
Anti-La (SS-B) Ab (RDL): <20 U (ref <20)
Anti-Nuclear Ab by IFA (RDL): POSITIVE — AB
Anti-Ro (SS-A) Ab (RDL): <20 U (ref <20)
Anti-Scl-70 Ab (RDL): <20 U (ref <20)
Anti-Sm Ab (RDL): <20 U (ref <20)
Anti-TPO Ab (RDL): 596.1 [IU]/mL — ABNORMAL HIGH (ref <9.0)
Anti-U1 RNP Ab (RDL): <20 U (ref <20)
Anti-dsDNA Ab by Farr(RDL): <8 [IU]/mL (ref <8.0)
C3 Complement (RDL): 118 mg/dL (ref 82–167)
C4 Complement (RDL): 23 mg/dL (ref 14–44)
Rheumatoid Factor by Turb RDL: <14 [IU]/mL (ref <14)

## 2023-10-25 LAB — ANA TITER AND PATTERN: Speckled Pattern: 1:160 {titer} — ABNORMAL HIGH

## 2023-10-26 ENCOUNTER — Encounter: Payer: BC Managed Care – PPO | Admitting: Family Medicine

## 2023-11-02 ENCOUNTER — Encounter: Payer: Self-pay | Admitting: Family Medicine

## 2023-11-02 ENCOUNTER — Ambulatory Visit (INDEPENDENT_AMBULATORY_CARE_PROVIDER_SITE_OTHER): Payer: BC Managed Care – PPO | Admitting: Family Medicine

## 2023-11-02 VITALS — BP 110/68 | Ht 70.0 in | Wt 123.0 lb

## 2023-11-02 DIAGNOSIS — E063 Autoimmune thyroiditis: Secondary | ICD-10-CM | POA: Diagnosis not present

## 2023-11-02 DIAGNOSIS — R946 Abnormal results of thyroid function studies: Secondary | ICD-10-CM

## 2023-11-02 DIAGNOSIS — F411 Generalized anxiety disorder: Secondary | ICD-10-CM | POA: Diagnosis not present

## 2023-11-02 DIAGNOSIS — M7918 Myalgia, other site: Secondary | ICD-10-CM

## 2023-11-02 DIAGNOSIS — G8929 Other chronic pain: Secondary | ICD-10-CM

## 2023-11-02 MED ORDER — BUSPIRONE HCL 5 MG PO TABS: 5.0000 mg | ORAL_TABLET | Freq: Three times a day (TID) | ORAL | 3 refills | Status: DC | PRN

## 2023-11-02 NOTE — Patient Instructions (Addendum)
Thank you for coming to the office today.  Return tomorrow 11/26 for AM fasting lab work for Thyroid full panel.  Hopefully we are catching this issue early, and can monitor it closely, but may not require treatment.  Possibly the pain can be explained with Fibromyalgia which can be related to thyroid.  Please schedule a Follow-up Appointment to: Return if symptoms worsen or fail to improve.  If you have any other questions or concerns, please feel free to call the office or send a message through MyChart. You may also schedule an earlier appointment if necessary.  Additionally, you may be receiving a survey about your experience at our office within a few days to 1 week by e-mail or mail. We value your feedback.  Saralyn Pilar, DO [place], [place]

## 2023-11-02 NOTE — Progress Notes (Signed)
Subjective:    Patient ID: Shane Gray, male    DOB: 2001-07-14, 22 y.o.   MRN: [id number]  Shane Gray is a 22 y.o. male presenting on 11/02/2023 for Chest Pain (Still complains of rib pain, follow up on recent labs )   HPI  Discussed the use of AI scribe software for clinical note transcription with the patient, who gave verbal consent to proceed.   The patient, with a history of chronic pain, was referred by his sports medicine doctor, Dr. Ashley Royalty, for further evaluation of abnormal thyroid panel results. The patient reports a family history of thyroid issues, with both parents requiring thyroid medication.   Labs show abnormal ANA 1:160 with speckled pattern indicating potential systemic autoimmune condition. In particular, Positive elevated TPO antibody 596.  The patient has been experiencing persistent fatigue, regardless of the amount of sleep he gets. He has also been dealing with stress related to a recent move. The patient has been taking Buspar as needed for stress management, but has recently needed to use it more frequently.  The patient also reports experiencing pain when raising his arm, describing a cracking sensation followed by discomfort. 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 The patient has not sought chiropractic treatment for this issue.          11/02/2023    2:18 PM 07/24/2023   10:24 AM 06/10/2023    3:30 PM  Depression screen PHQ 2/9  Decreased Interest 1 1 1   Down, Depressed, Hopeless 1 2 1   PHQ - 2 Score 2 3 2   Altered sleeping 2 3 3   Tired, decreased energy 3 3 2   Change in appetite 3 2 2   Feeling bad or failure about yourself  1 2 1   Trouble concentrating 2 1 2   Moving slowly or fidgety/restless 2 2 3   Suicidal thoughts 0 0 0  PHQ-9 Score 15 16 15   Difficult doing work/chores Somewhat difficult Somewhat difficult Somewhat difficult       11/02/2023    2:18 PM  07/24/2023   10:24 AM 06/10/2023    3:30 PM 01/16/2023    9:16 AM  GAD 7 : Generalized Anxiety Score  Nervous, Anxious, on Edge 2 3 3 3   Control/stop worrying 3 3 3 3   Worry too much - different things 3 3 3 3   Trouble relaxing 3 3 2 3   Restless 3 2 2 3   Easily annoyed or irritable 3 3 3 3   Afraid - awful might happen 3 3 3 3   Total GAD 7 Score 20 20 19 21   Anxiety Difficulty Somewhat difficult Somewhat difficult Not difficult at all Somewhat difficult    Social History   Tobacco Use   Smoking status: Never   Smokeless tobacco: Never  Vaping Use   Vaping status: Never Used  Substance Use Topics   Alcohol use: Yes    Alcohol/week: 2.0 standard drinks of alcohol    Types: 1 Cans of beer, 1 Standard drinks or equivalent per week   Drug use: Yes    Types: Marijuana    Comment: delta 8, last used used 11/28/21    Review of Systems Per HPI unless specifically indicated above     Objective:    BP 110/68   Ht 5\' 10"  (1.778 m)   Wt 123 lb (55.8 kg)   BMI 17.65 kg/m   Wt Readings from Last 3 Encounters:  11/02/23  123 lb (55.8 kg)  10/13/23 124 lb (56.2 kg)  07/24/23 122 lb 9.6 oz (55.6 kg)    Physical Exam Vitals and nursing note reviewed.  Constitutional:      General: He is not in acute distress.    Appearance: Normal appearance. He is well-developed. He is not diaphoretic.     Comments: Well-appearing, comfortable, cooperative  HENT:     Head: Normocephalic and atraumatic.  Eyes:     General:        Right eye: No discharge.        Left eye: No discharge.     Conjunctiva/sclera: Conjunctivae normal.  Neck:     Comments: No thyromegaly Cardiovascular:     Rate and Rhythm: Normal rate.  Pulmonary:     Effort: Pulmonary effort is normal.  Musculoskeletal:     Cervical back: Normal range of motion and neck supple. No rigidity or tenderness.  Skin:    General: Skin is warm and dry.     Findings: No erythema or rash.  Neurological:     Mental Status: He is alert  and oriented to person, place, and time.  Psychiatric:        Mood and Affect: Mood normal.        Behavior: Behavior normal.        Thought Content: Thought content normal.     Comments: Well groomed, good eye contact, normal speech and thoughts     Results for orders placed or performed in visit on 10/13/23  ANA 12Plus Profile, Do All RDL  Result Value Ref Range   Anti-Nuclear Ab by IFA (RDL) Positive (A) Negative   Anti-Centromere Ab (RDL) <1:40 <1:40   Anti-dsDNA Ab by Farr(RDL) <8.0 <8.0 IU/mL   Anti-Sm Ab (RDL) <20 <20 Units   Anti-U1 RNP Ab (RDL) <20 <20 Units   Anti-Ro (SS-A) Ab (RDL) <20 <20 Units   Anti-La (SS-B) Ab (RDL) <20 <20 Units   Anti-Scl-70 Ab (RDL) <20 <20 Units   Anti-Cardiolipin Ab, IgG (RDL) <15 <15 GPL U/mL   Anti-Cardiolipin Ab, IgA (RDL) <12 <12 APL U/mL   Anti-Cardiolipin Ab, IgM (RDL) <13 <13 MPL U/mL   C3 Complement (RDL) 118 82 - 167 mg/dL   C4 Complement (RDL) 23 14 - 44 mg/dL   Anti-TPO Ab (RDL) 409.8 (H) <9.0 IU/mL   Anti-Chromatin Ab, IgG (RDL) <20 <20 Units   Anti-CCP Ab, IgG & IgA (RDL) <20 <20 Units   Rheumatoid Factor by Turb RDL <14 <14 IU/mL  ANA Titer and Pattern  Result Value Ref Range   Speckled Pattern 1:160 (H) <1:40   Note: Comment       Assessment & Plan:   Problem List Items Addressed This Visit     Chronic musculoskeletal pain   GAD (generalized anxiety disorder)   Relevant Medications   busPIRone (BUSPAR) 5 MG tablet   Other Visit Diagnoses     Abnormal thyroid function test    -  Primary   Relevant Orders   Thyroid Panel With TSH   T4, free   Thyroid peroxidase antibody   Chronic autoimmune thyroiditis       Relevant Orders   Thyroid Panel With TSH   T4, free   Thyroid peroxidase antibody         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 Family history of thyroid issues. Patient reports  chronic fatigue. No palpable thyroid nodules  on physical examination. -Order comprehensive thyroid panel tomorrow on 11/03/2023. -Consider future thyroid ultrasound based on lab results.  Chronic Pain Possible fibromyalgia suggested by chronic pain and possible link to autoimmune thyroid condition. Pain currently manageable. -Continue collaboration with Dr. Ashley Royalty for pain management.  Medication Management Patient taking Buspar as needed for stress, recently increased usage due to stress from moving. -Refill Buspar prescription, 5mg  up to three times a day as needed, 90 pills to [facility].         Orders Placed This Encounter  Procedures   Thyroid Panel With TSH    Standing Status:   Future    Standing Expiration Date:   11/01/2024   T4, free    Standing Status:   Future    Standing Expiration Date:   11/01/2024   Thyroid peroxidase antibody    Standing Status:   Future    Standing Expiration Date:   11/01/2024    Meds ordered this encounter  Medications   busPIRone (BUSPAR) 5 MG tablet    Sig: Take 1 tablet (5 mg total) by mouth 3 (three) times daily as needed (anxiety).    Dispense:  90 tablet    Refill:  3    Follow up plan: Return if symptoms worsen or fail to improve.  Future labs ordered for 11/03/23 TSH Panel + Free T4 + Thyroid peroxidase antibody   Saralyn Pilar, DO [facility] 11/02/2023, 2:02 PM

## 2023-11-03 ENCOUNTER — Other Ambulatory Visit: Payer: BC Managed Care – PPO

## 2023-11-03 DIAGNOSIS — E063 Autoimmune thyroiditis: Secondary | ICD-10-CM

## 2023-11-03 DIAGNOSIS — R946 Abnormal results of thyroid function studies: Secondary | ICD-10-CM | POA: Diagnosis not present

## 2023-11-05 LAB — THYROID PANEL WITH TSH
Free Thyroxine Index: 2.8 (ref 1.4–3.8)
T3 Uptake: 35 % (ref 22–35)
T4, Total: 7.9 ug/dL (ref 4.9–10.5)
TSH: 4.96 m[IU]/L — ABNORMAL HIGH (ref 0.40–4.50)

## 2023-11-05 LAB — THYROID PEROXIDASE ANTIBODY: Thyroperoxidase Ab SerPl-aCnc: 602 [IU]/mL — ABNORMAL HIGH (ref <9)

## 2023-11-05 LAB — T4, FREE: Free T4: 1.5 ng/dL (ref 0.8–1.8)

## 2023-11-26 ENCOUNTER — Encounter: Payer: Self-pay | Admitting: Family Medicine

## 2023-12-11 ENCOUNTER — Ambulatory Visit: Payer: BC Managed Care – PPO | Admitting: Family Medicine

## 2023-12-14 ENCOUNTER — Ambulatory Visit: Payer: BC Managed Care – PPO | Admitting: Family Medicine

## 2024-01-01 ENCOUNTER — Encounter: Payer: BC Managed Care – PPO | Admitting: Family Medicine

## 2024-02-11 ENCOUNTER — Ambulatory Visit: Payer: Self-pay | Admitting: Family Medicine

## 2024-04-17 ENCOUNTER — Encounter: Payer: Self-pay | Admitting: Family Medicine

## 2024-06-14 ENCOUNTER — Other Ambulatory Visit: Payer: Self-pay | Admitting: Family Medicine

## 2024-06-14 DIAGNOSIS — F411 Generalized anxiety disorder: Secondary | ICD-10-CM

## 2024-06-16 NOTE — Telephone Encounter (Signed)
 Courtesy refill. Patient will need an office visit for additional refills.  Requested Prescriptions  Pending Prescriptions Disp Refills   escitalopram  (LEXAPRO ) 10 MG tablet [Pharmacy Med Name: ESCITALOPRAM  10MG  TABLETS] 30 tablet 0    Sig: TAKE 1 TABLET(10 MG) BY MOUTH DAILY     Psychiatry:  Antidepressants - SSRI Failed - 06/16/2024  1:27 PM      Failed - Valid encounter within last 6 months    Recent Outpatient Visits   None            Passed - Completed PHQ-2 or PHQ-9 in the last 360 days

## 2024-07-05 ENCOUNTER — Other Ambulatory Visit: Payer: Self-pay

## 2024-07-05 ENCOUNTER — Telehealth: Payer: Self-pay

## 2024-07-05 DIAGNOSIS — F411 Generalized anxiety disorder: Secondary | ICD-10-CM

## 2024-07-05 MED ORDER — ESCITALOPRAM OXALATE 10 MG PO TABS: 10.0000 mg | ORAL_TABLET | Freq: Every day | ORAL | 0 refills | Status: DC

## 2024-07-05 NOTE — Telephone Encounter (Signed)
 Copied from CRM [id number]. Topic: Clinical - Prescription Issue >> Jul 05, 2024  2:14 PM Avram MATSU wrote: Reason for CRM: pt is wondering if he can go back on escitalopram  (LEXAPRO ) 20 MG tablet [[id number]]  Please call pt back with any concerns  [phone] (H)

## 2024-07-05 NOTE — Telephone Encounter (Signed)
 Copied from CRM [id number]. Topic: Clinical - Prescription Issue >> Jul 05, 2024  2:40 PM Carlatta H wrote: Reason for CRM: Patient received a call from nurse//Please call back//No notes in chart

## 2024-07-12 ENCOUNTER — Other Ambulatory Visit: Payer: Self-pay | Admitting: Family Medicine

## 2024-07-12 DIAGNOSIS — F411 Generalized anxiety disorder: Secondary | ICD-10-CM

## 2024-07-14 NOTE — Telephone Encounter (Signed)
 Pt needs appointment. Pt was given 30 day courtesy refill on 07/05/24. Refusing the request for refill further than the 30 days. Called pt and LM on VM to call and make appt for med refils and f/u.      Requested Prescriptions  Pending Prescriptions Disp Refills   escitalopram  (LEXAPRO ) 10 MG tablet [Pharmacy Med Name: ESCITALOPRAM  10MG  TABLETS] 30 tablet 0    Sig: TAKE 1 TABLET(10 MG) BY MOUTH DAILY     There is no refill protocol information for this order

## 2024-08-07 ENCOUNTER — Other Ambulatory Visit: Payer: Self-pay | Admitting: Family Medicine

## 2024-08-07 DIAGNOSIS — F411 Generalized anxiety disorder: Secondary | ICD-10-CM

## 2024-08-09 NOTE — Telephone Encounter (Signed)
 Requested medication (s) are due for refill today: yes  Requested medication (s) are on the active medication list: yes  Last refill:  07/05/24 #30  Future visit scheduled: no  Notes to clinic:  called pt and LM on VM to make appointment. Last filled was a courtesy refill   Requested Prescriptions  Pending Prescriptions Disp Refills   escitalopram  (LEXAPRO ) 10 MG tablet [Pharmacy Med Name: ESCITALOPRAM  10MG  TABLETS] 30 tablet 0    Sig: TAKE 1 TABLET(10 MG) BY MOUTH DAILY     Psychiatry:  Antidepressants - SSRI Failed - 08/09/2024  1:23 PM      Failed - Valid encounter within last 6 months    Recent Outpatient Visits   None            Passed - Completed PHQ-2 or PHQ-9 in the last 360 days

## 2024-09-08 ENCOUNTER — Other Ambulatory Visit: Payer: Self-pay | Admitting: Family Medicine

## 2024-09-08 DIAGNOSIS — F411 Generalized anxiety disorder: Secondary | ICD-10-CM

## 2024-09-09 NOTE — Telephone Encounter (Signed)
 Unable to refill per protocol, appointment needed.   Requested Prescriptions  Pending Prescriptions Disp Refills   escitalopram  (LEXAPRO ) 10 MG tablet [Pharmacy Med Name: ESCITALOPRAM  10MG  TABLETS] 30 tablet 0    Sig: TAKE 1 TABLET(10 MG) BY MOUTH DAILY     Psychiatry:  Antidepressants - SSRI Failed - 09/09/2024  1:58 PM      Failed - Valid encounter within last 6 months    Recent Outpatient Visits   None            Passed - Completed PHQ-2 or PHQ-9 in the last 360 days

## 2024-11-16 ENCOUNTER — Encounter: Payer: Self-pay | Admitting: Family Medicine

## 2024-11-16 ENCOUNTER — Ambulatory Visit: Admitting: Family Medicine

## 2024-11-16 VITALS — BP 118/70 | HR 102 | Ht 70.0 in | Wt 171.8 lb

## 2024-11-16 DIAGNOSIS — F411 Generalized anxiety disorder: Secondary | ICD-10-CM | POA: Diagnosis not present

## 2024-11-16 DIAGNOSIS — K219 Gastro-esophageal reflux disease without esophagitis: Secondary | ICD-10-CM | POA: Diagnosis not present

## 2024-11-16 DIAGNOSIS — M549 Dorsalgia, unspecified: Secondary | ICD-10-CM | POA: Diagnosis not present

## 2024-11-16 DIAGNOSIS — G8929 Other chronic pain: Secondary | ICD-10-CM

## 2024-11-16 DIAGNOSIS — R7989 Other specified abnormal findings of blood chemistry: Secondary | ICD-10-CM | POA: Diagnosis not present

## 2024-11-16 DIAGNOSIS — M25512 Pain in left shoulder: Secondary | ICD-10-CM | POA: Diagnosis not present

## 2024-11-16 MED ORDER — BUSPIRONE HCL 5 MG PO TABS: 5.0000 mg | ORAL_TABLET | Freq: Three times a day (TID) | ORAL | 3 refills | Status: AC | PRN

## 2024-11-16 MED ORDER — BACLOFEN 10 MG PO TABS: 5.0000 mg | ORAL_TABLET | Freq: Two times a day (BID) | ORAL | 2 refills | Status: AC | PRN

## 2024-11-16 MED ORDER — ESCITALOPRAM OXALATE 10 MG PO TABS: 10.0000 mg | ORAL_TABLET | Freq: Every day | ORAL | 3 refills | Status: AC

## 2024-11-16 NOTE — Patient Instructions (Addendum)
 Thank you for coming to the office today.  TDap tetanus vaccine 345pm on Friday 12/12  Refills going to pharmacy today  Escitalopram  10mg  daily 90 day for a year  Buspar  5mg   TWICE A DAY as needed for up to 1 year.  Refilled Baclofen  muscle relaxant for any aches pains muscle  spasm back pain. As needed.  We will see you back for annual physical in November next year and do labs after  Please schedule a Follow-up Appointment to: Return for TDap 12/12, then 1 year Annual Physical November labs after.  If you have any other questions or concerns, please feel free to call the office or send a message through MyChart. You may also schedule an earlier appointment if necessary.  Additionally, you may be receiving a survey about your experience at our office within a few days to 1 week by e-mail or mail. We value your feedback.  Marsa Officer, DO [facility], [place]

## 2024-11-16 NOTE — Progress Notes (Signed)
 Subjective:    Patient ID: Shane Gray, male    DOB: 18-Jul-2001, 23 y.o.   MRN: [id number]  [name] is a 23 y.o. male presenting on 11/16/2024 for Medical Management of Chronic Issues (Med check, has been off medications for about a month ) and Anxiety   HPI   Discussed the use of AI scribe software for clinical note transcription with the patient, who gave verbal consent to proceed.  History of Present Illness   Shane Gray Shane Gray is a 23 year old male who presents for a medication check follow-up.  Generalized Anxiety and Panic Chronic problem w/ Anxiety symptoms and pharmacologic management has been successful - Out of meds x 1 month, request restart - Currently taking Lexapro  10 mg daily, which is helpful for anxiety control. - Uses Buspar  as needed; frequency varies depending on anxiety severity.  Chronic Musculoskeletal pain Back Pain / Shoulder pain - History of muscle pain in shoulder and back, previously associated with lifting heavy items at prior employment. - Muscle pain has improved since changing jobs; currently experiences mild discomfort with slouching or lifting heavy objects at new job. - Pain is not severe. - Previously used Baclofen  muscle relaxant, anti-inflammatory medication, and topical cream for muscle pain. - No longer needs NSAID  GERD / Gastroesophageal symptoms - Burning sensation in throat and upper chest after consuming carbonated beverages. - Symptoms began following a stomach bug approximately one month ago. - Sensation builds up into throat and subsides after burping. - Associates symptoms with acid reflux, particularly triggered by carbonation.  Immunization status - Due for tetanus vaccine; last received in 2014. - Does not typically receive annual influenza vaccine.  Elevated TSH / Thyroid  function monitoring - Recalls previous discussions regarding monitoring thyroid  hormone levels. - No current concerns  regarding thyroid  function.          11/16/2024    4:25 PM 11/02/2023    2:18 PM 07/24/2023   10:24 AM  Depression screen PHQ 2/9  Decreased Interest 2 1 1   Down, Depressed, Hopeless 1 1 2   PHQ - 2 Score 3 2 3   Altered sleeping 0 2 3  Tired, decreased energy 2 3 3   Change in appetite 1 3 2   Feeling bad or failure about yourself  1 1 2   Trouble concentrating 3 2 1   Moving slowly or fidgety/restless 3 2 2   Suicidal thoughts 0 0 0  PHQ-9 Score 13 15  16    Difficult doing work/chores Somewhat difficult Somewhat difficult Somewhat difficult     Data saved with a previous flowsheet row definition       11/16/2024    4:25 PM 11/02/2023    2:18 PM 07/24/2023   10:24 AM 06/10/2023    3:30 PM  GAD 7 : Generalized Anxiety Score  Nervous, Anxious, on Edge 3 2 3 3   Control/stop worrying 3 3 3 3   Worry too much - different things 3 3 3 3   Trouble relaxing 3 3 3 2   Restless 3 3 2 2   Easily annoyed or irritable 3 3 3 3   Afraid - awful might happen 3 3 3 3   Total GAD 7 Score 21 20 20 19   Anxiety Difficulty Very difficult Somewhat difficult Somewhat difficult Not difficult at all    Social History   Tobacco Use   Smoking status: Never   Smokeless tobacco: Never  Vaping Use   Vaping status: Never Used  Substance Use Topics   Alcohol  use: Yes    Alcohol/week: 2.0 standard drinks of alcohol    Types: 1 Cans of beer, 1 Standard drinks or equivalent per week   Drug use: Yes    Types: Marijuana    Comment: delta 8, last used used 11/28/21    Review of Systems Per HPI unless specifically indicated above     Objective:    BP 118/70 (BP Location: Left Arm, Patient Position: Sitting, Cuff Size: Normal)   Pulse (!) 102   Ht 5' 10 (1.778 m)   Wt 171 lb 12.8 oz (77.9 kg)   SpO2 98%   BMI 24.65 kg/m   Wt Readings from Last 3 Encounters:  11/16/24 171 lb 12.8 oz (77.9 kg)  11/02/23 123 lb (55.8 kg)  10/13/23 124 lb (56.2 kg)    Physical Exam Vitals and nursing note  reviewed.  Constitutional:      General: He is not in acute distress.    Appearance: Normal appearance. He is well-developed. He is not diaphoretic.     Comments: Well-appearing, comfortable, cooperative  HENT:     Head: Normocephalic and atraumatic.  Eyes:     General:        Right eye: No discharge.        Left eye: No discharge.     Conjunctiva/sclera: Conjunctivae normal.  Cardiovascular:     Rate and Rhythm: Normal rate.  Pulmonary:     Effort: Pulmonary effort is normal.  Skin:    General: Skin is warm and dry.     Findings: No erythema or rash.  Neurological:     Mental Status: He is alert and oriented to person, place, and time.  Psychiatric:        Mood and Affect: Mood normal.        Behavior: Behavior normal.        Thought Content: Thought content normal.     Comments: Well groomed, good eye contact, normal speech and thoughts     Results for orders placed or performed in visit on 11/03/23  Thyroid  peroxidase antibody   Collection Time: 11/03/23  9:02 AM  Result Value Ref Range   Thyroperoxidase Ab SerPl-aCnc 602 (H) <9 IU/mL  T4, free   Collection Time: 11/03/23  9:02 AM  Result Value Ref Range   Free T4 1.5 0.8 - 1.8 ng/dL  Thyroid  Panel With TSH   Collection Time: 11/03/23  9:02 AM  Result Value Ref Range   T3 Uptake 35 22 - 35 %   T4, Total 7.9 4.9 - 10.5 mcg/dL   Free Thyroxine Index 2.8 1.4 - 3.8   TSH 4.96 (H) 0.40 - 4.50 mIU/L      Assessment & Plan:   Problem List Items Addressed This Visit     GAD (generalized anxiety disorder) - Primary   Relevant Medications   escitalopram  (LEXAPRO ) 10 MG tablet   busPIRone  (BUSPAR ) 5 MG tablet   Other Visit Diagnoses       Chronic pain in left shoulder       Relevant Medications   escitalopram  (LEXAPRO ) 10 MG tablet   baclofen  (LIORESAL ) 10 MG tablet     Upper back pain       Relevant Medications   busPIRone  (BUSPAR ) 5 MG tablet   baclofen  (LIORESAL ) 10 MG tablet     Gastroesophageal reflux  disease without esophagitis         Elevated TSH            Generalized  anxiety disorder Chronic Anxiety with panic Stable currently but out of meds past 1 month since lapsed 1 year since last visit Managed with Lexapro  10 mg daily and Buspar  as needed. - Prescribed Lexapro  10 mg daily with 90-day supply for one year. - Prescribed Buspar  5 mg twice daily as needed for one year.  Chronic musculoskeletal pain (shoulder and back) Intermittent pain exacerbated by lifting. Baclofen  used as needed. - Prescribed baclofen  as needed for muscle spasms and back pain. - Discontinue NSAID rx Nabumetone  - Advised use of over-the-counter ibuprofen  or Aleve for pain management.  Gastroesophageal reflux disease Symptoms triggered by carbonation and caffeine. - Advised use of antacids such as Tums, Quick Relief, Maalox, or Mylanta as needed. - Recommended lifestyle modifications including avoiding triggers and elevating the head of the bed. - Consider H2 or PPI in future if persistent.  History Elevated TSH Previous labs, we have investigated further and will monitor labs for surveillance nex tyear  General health maintenance Tetanus vaccine due. Thyroid  surveillance planned for next year. - Scheduled tetanus vaccine (TDAP) for December 12th, 2025, at 3:45 PM. Next year annual Nov 2026 and labs after       No orders of the defined types were placed in this encounter.   Meds ordered this encounter  Medications   escitalopram  (LEXAPRO ) 10 MG tablet    Sig: Take 1 tablet (10 mg total) by mouth daily.    Dispense:  90 tablet    Refill:  3   busPIRone  (BUSPAR ) 5 MG tablet    Sig: Take 1 tablet (5 mg total) by mouth 3 (three) times daily as needed (anxiety).    Dispense:  90 tablet    Refill:  3   baclofen  (LIORESAL ) 10 MG tablet    Sig: Take 0.5-1 tablets (5-10 mg total) by mouth 2 (two) times daily as needed for muscle spasms.    Dispense:  60 each    Refill:  2    Follow up  plan: Return for TDap 12/12, then 1 year Annual Physical November labs after.   Marsa Officer, DO [facility] 11/16/2024, 4:32 PM

## 2024-11-17 ENCOUNTER — Telehealth: Payer: Self-pay

## 2024-11-17 NOTE — Telephone Encounter (Signed)
 Left message for patient to return call OK  to advise  the prescription was sent in for 90 tablets patient need to contact pharmacy about the amount of tablets that were given

## 2024-11-17 NOTE — Telephone Encounter (Signed)
 Copied from CRM #[id number]. Topic: Clinical - Prescription Issue >> Nov 17, 2024  3:56 PM Santiya F wrote: Reason for CRM: Patient is calling in because he was supposed to have 90 tablets and 3 refills on his escitalopram  (LEXAPRO ) 10 MG tablet [[id number]] but the pharmacy only gave him 30 tablets. Patient says he dicussed his provider giving him 90 tablets. Please follow up with patient.

## 2024-11-18 ENCOUNTER — Ambulatory Visit (INDEPENDENT_AMBULATORY_CARE_PROVIDER_SITE_OTHER)

## 2024-11-18 DIAGNOSIS — Z23 Encounter for immunization: Secondary | ICD-10-CM

## 2025-10-20 ENCOUNTER — Encounter: Admitting: Family Medicine
